# Patient Record
Sex: Male | Born: 1960 | Race: Black or African American | Hispanic: No | Smoking: Current every day smoker
Health system: Southern US, Community
[De-identification: ages and names within clinical notes are randomized; demographics above are authoritative.]

## PROBLEM LIST (undated history)

## (undated) DIAGNOSIS — B1920 Unspecified viral hepatitis C without hepatic coma: Secondary | ICD-10-CM

## (undated) DIAGNOSIS — F419 Anxiety disorder, unspecified: Secondary | ICD-10-CM

## (undated) DIAGNOSIS — F410 Panic disorder [episodic paroxysmal anxiety] without agoraphobia: Secondary | ICD-10-CM

## (undated) DIAGNOSIS — F209 Schizophrenia, unspecified: Secondary | ICD-10-CM

## (undated) DIAGNOSIS — I10 Essential (primary) hypertension: Secondary | ICD-10-CM

## (undated) DIAGNOSIS — F319 Bipolar disorder, unspecified: Secondary | ICD-10-CM

## (undated) HISTORY — PX: OTHER SURGICAL HISTORY: SHX169

---

## 2000-11-27 ENCOUNTER — Emergency Department (HOSPITAL_COMMUNITY): Admission: EM | Admit: 2000-11-27 | Discharge: 2000-11-27 | Payer: Self-pay | Admitting: Emergency Medicine

## 2001-03-21 ENCOUNTER — Emergency Department (HOSPITAL_COMMUNITY): Admission: EM | Admit: 2001-03-21 | Discharge: 2001-03-21 | Payer: Self-pay | Admitting: Emergency Medicine

## 2001-03-22 ENCOUNTER — Emergency Department (HOSPITAL_COMMUNITY): Admission: EM | Admit: 2001-03-22 | Discharge: 2001-03-22 | Payer: Self-pay | Admitting: *Deleted

## 2001-03-23 ENCOUNTER — Emergency Department (HOSPITAL_COMMUNITY): Admission: EM | Admit: 2001-03-23 | Discharge: 2001-03-23 | Payer: Self-pay | Admitting: *Deleted

## 2001-08-07 ENCOUNTER — Emergency Department (HOSPITAL_COMMUNITY): Admission: EM | Admit: 2001-08-07 | Discharge: 2001-08-07 | Payer: Self-pay | Admitting: *Deleted

## 2002-05-22 ENCOUNTER — Inpatient Hospital Stay (HOSPITAL_COMMUNITY): Admission: EM | Admit: 2002-05-22 | Discharge: 2002-05-28 | Payer: Self-pay | Admitting: *Deleted

## 2004-04-04 ENCOUNTER — Emergency Department (HOSPITAL_COMMUNITY): Admission: EM | Admit: 2004-04-04 | Discharge: 2004-04-05 | Payer: Self-pay | Admitting: Emergency Medicine

## 2005-07-14 ENCOUNTER — Inpatient Hospital Stay (HOSPITAL_COMMUNITY): Admission: EM | Admit: 2005-07-14 | Discharge: 2005-07-16 | Payer: Self-pay | Admitting: Emergency Medicine

## 2005-08-02 ENCOUNTER — Emergency Department (HOSPITAL_COMMUNITY): Admission: EM | Admit: 2005-08-02 | Discharge: 2005-08-02 | Payer: Self-pay | Admitting: Emergency Medicine

## 2006-08-04 ENCOUNTER — Emergency Department (HOSPITAL_COMMUNITY): Admission: EM | Admit: 2006-08-04 | Discharge: 2006-08-04 | Payer: Self-pay | Admitting: Emergency Medicine

## 2006-08-20 ENCOUNTER — Emergency Department (HOSPITAL_COMMUNITY): Admission: EM | Admit: 2006-08-20 | Discharge: 2006-08-20 | Payer: Self-pay | Admitting: Emergency Medicine

## 2006-12-13 ENCOUNTER — Emergency Department (HOSPITAL_COMMUNITY): Admission: EM | Admit: 2006-12-13 | Discharge: 2006-12-14 | Payer: Self-pay | Admitting: Emergency Medicine

## 2007-07-03 IMAGING — CR DG CHEST 2V
2 series · 2 of 2 positions shown · non-contrast
Comparison: 07/14/2005.

CLINICAL DATA: Smoker. Medical clearance. Suicidal.

CHEST - 2 VIEW

[w chest pa]
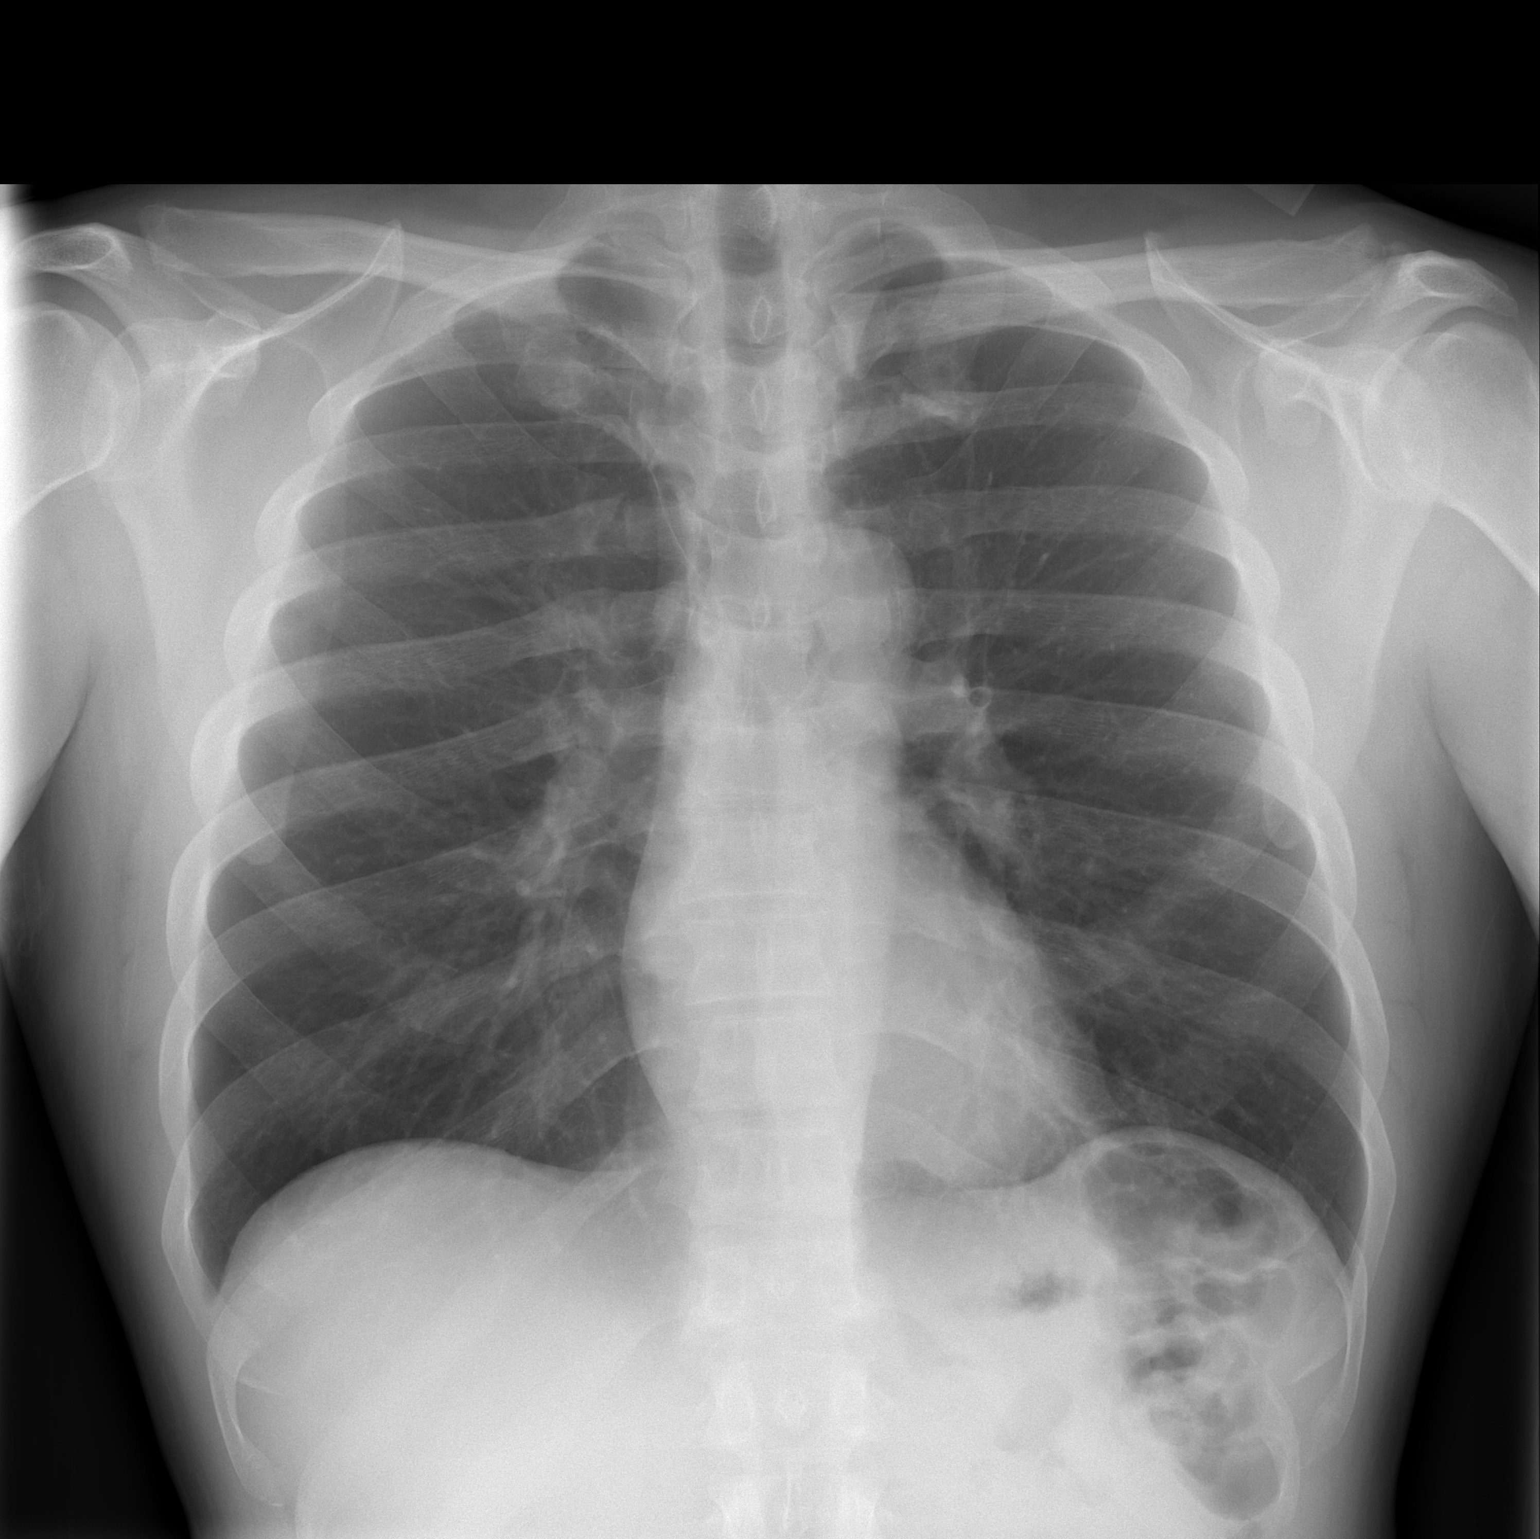

[w chest lat]
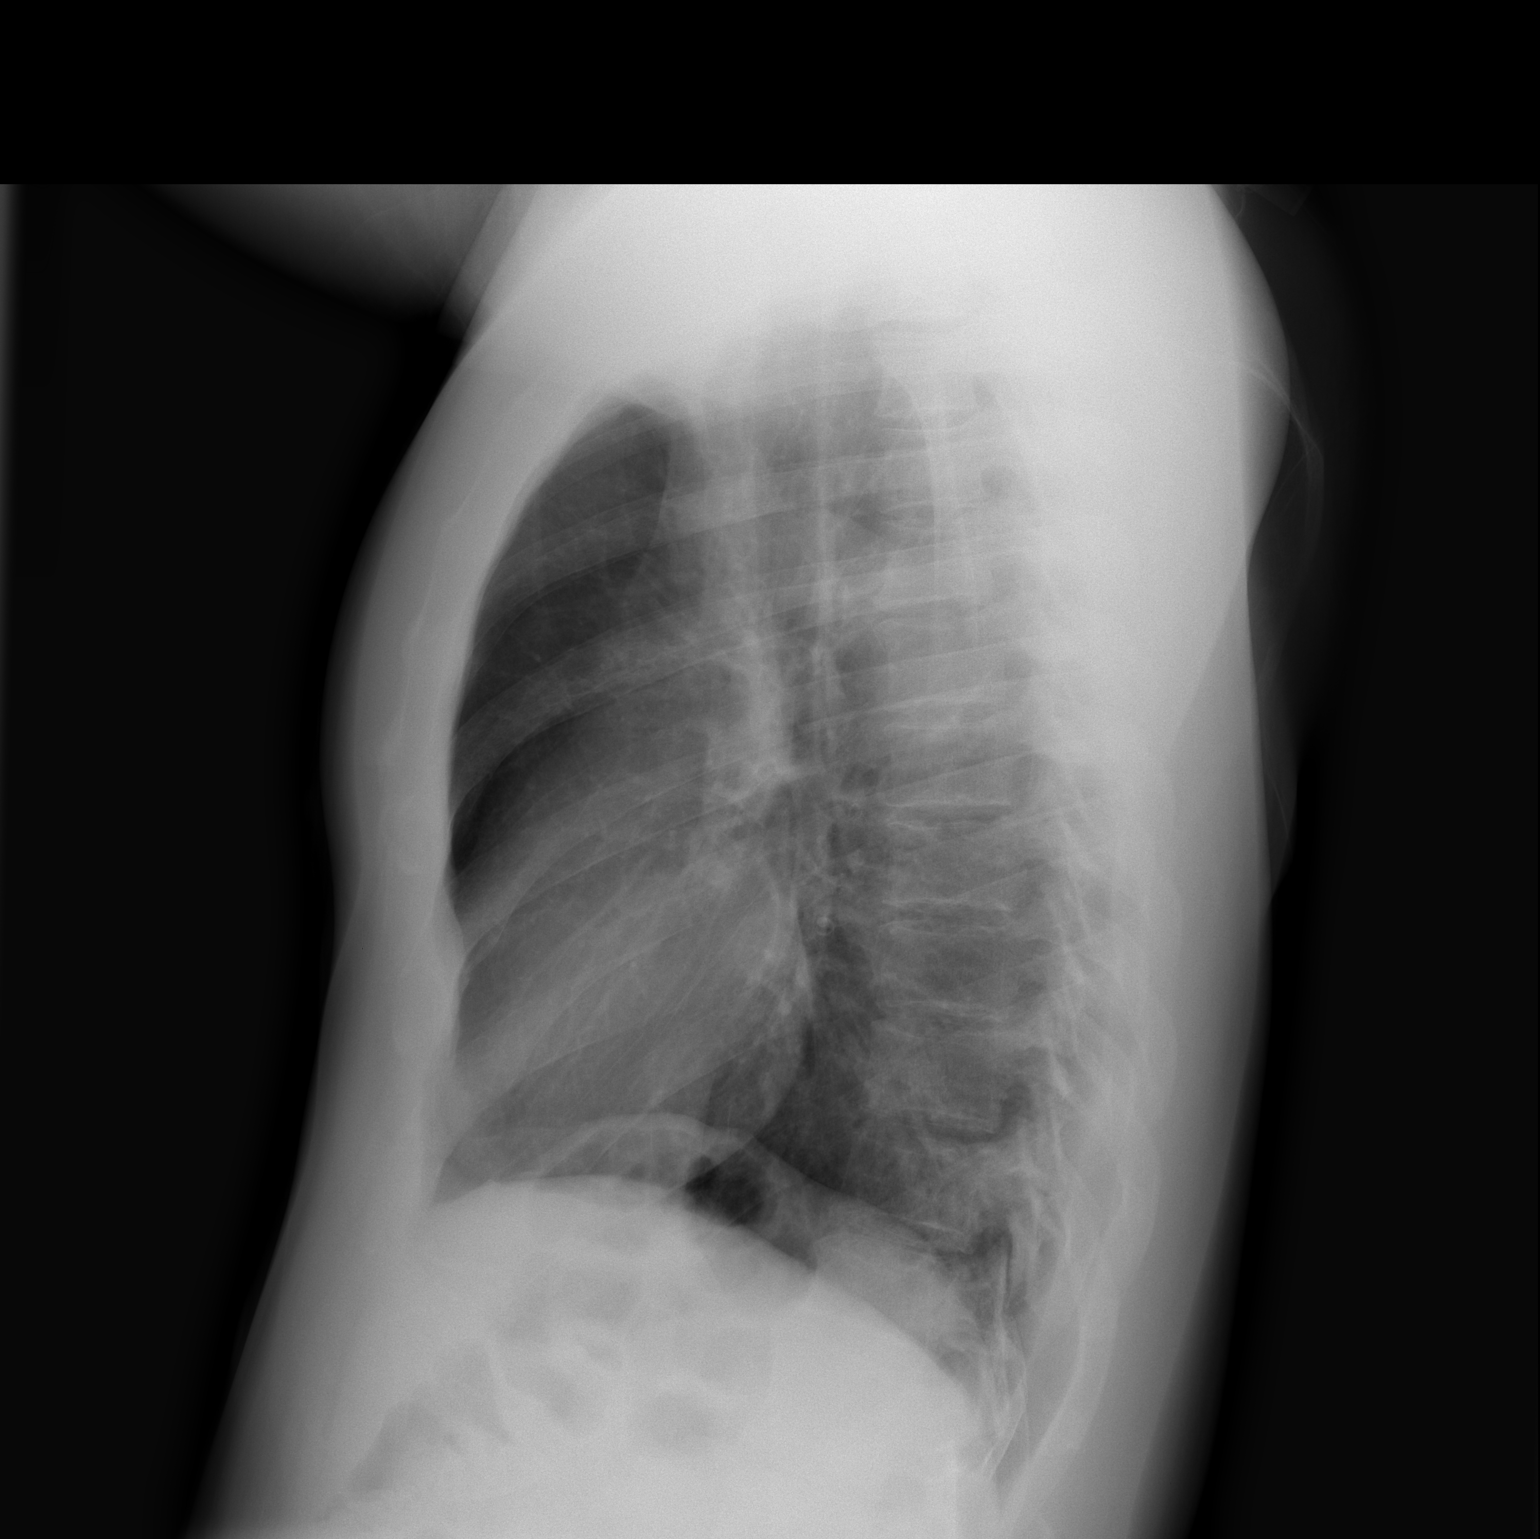

[2 of 2 positions shown; findings below may reference images not displayed]

FINDINGS: Stable normal sized heart and clear lungs. Mild central peribronchial
thickening. Minimal scoliosis.

IMPRESSION

Mild chronic bronchitic changes. No acute abnormality.

## 2015-03-31 ENCOUNTER — Encounter (HOSPITAL_COMMUNITY): Payer: Self-pay | Admitting: *Deleted

## 2015-03-31 ENCOUNTER — Encounter (HOSPITAL_COMMUNITY): Payer: Self-pay | Admitting: Emergency Medicine

## 2015-03-31 ENCOUNTER — Inpatient Hospital Stay (HOSPITAL_COMMUNITY)
Admission: AD | Admit: 2015-03-31 | Discharge: 2015-04-06 | DRG: 885 | Payer: No Typology Code available for payment source | Source: Intra-hospital | Attending: Psychiatry | Admitting: Psychiatry

## 2015-03-31 ENCOUNTER — Emergency Department (HOSPITAL_COMMUNITY)
Admission: EM | Admit: 2015-03-31 | Discharge: 2015-03-31 | Disposition: A | Discharge status: Other Acute Inpatient Hospital | Payer: Self-pay | Attending: Emergency Medicine | Admitting: Emergency Medicine

## 2015-03-31 DIAGNOSIS — F3130 Bipolar disorder, current episode depressed, mild or moderate severity, unspecified: Secondary | ICD-10-CM | POA: Diagnosis present

## 2015-03-31 DIAGNOSIS — R45851 Suicidal ideations: Secondary | ICD-10-CM | POA: Diagnosis present

## 2015-03-31 DIAGNOSIS — F410 Panic disorder [episodic paroxysmal anxiety] without agoraphobia: Secondary | ICD-10-CM | POA: Insufficient documentation

## 2015-03-31 DIAGNOSIS — F32A Depression, unspecified: Secondary | ICD-10-CM

## 2015-03-31 DIAGNOSIS — F1410 Cocaine abuse, uncomplicated: Secondary | ICD-10-CM | POA: Diagnosis present

## 2015-03-31 DIAGNOSIS — F1414 Cocaine abuse with cocaine-induced mood disorder: Secondary | ICD-10-CM | POA: Diagnosis present

## 2015-03-31 DIAGNOSIS — I10 Essential (primary) hypertension: Secondary | ICD-10-CM | POA: Diagnosis present

## 2015-03-31 DIAGNOSIS — B1920 Unspecified viral hepatitis C without hepatic coma: Secondary | ICD-10-CM | POA: Diagnosis present

## 2015-03-31 DIAGNOSIS — Z8619 Personal history of other infectious and parasitic diseases: Secondary | ICD-10-CM | POA: Insufficient documentation

## 2015-03-31 DIAGNOSIS — Z79899 Other long term (current) drug therapy: Secondary | ICD-10-CM | POA: Insufficient documentation

## 2015-03-31 DIAGNOSIS — F17210 Nicotine dependence, cigarettes, uncomplicated: Secondary | ICD-10-CM | POA: Diagnosis present

## 2015-03-31 DIAGNOSIS — F3132 Bipolar disorder, current episode depressed, moderate: Principal | ICD-10-CM | POA: Diagnosis present

## 2015-03-31 DIAGNOSIS — Z720 Tobacco use: Secondary | ICD-10-CM | POA: Insufficient documentation

## 2015-03-31 DIAGNOSIS — Z9114 Patient's other noncompliance with medication regimen: Secondary | ICD-10-CM | POA: Diagnosis present

## 2015-03-31 DIAGNOSIS — F39 Unspecified mood [affective] disorder: Secondary | ICD-10-CM | POA: Diagnosis present

## 2015-03-31 DIAGNOSIS — F4310 Post-traumatic stress disorder, unspecified: Secondary | ICD-10-CM | POA: Diagnosis present

## 2015-03-31 DIAGNOSIS — F329 Major depressive disorder, single episode, unspecified: Secondary | ICD-10-CM

## 2015-03-31 DIAGNOSIS — F209 Schizophrenia, unspecified: Secondary | ICD-10-CM | POA: Insufficient documentation

## 2015-03-31 HISTORY — DX: Schizophrenia, unspecified: F20.9

## 2015-03-31 HISTORY — DX: Bipolar disorder, unspecified: F31.9

## 2015-03-31 HISTORY — DX: Panic disorder (episodic paroxysmal anxiety): F41.0

## 2015-03-31 HISTORY — DX: Essential (primary) hypertension: I10

## 2015-03-31 HISTORY — DX: Anxiety disorder, unspecified: F41.9

## 2015-03-31 HISTORY — DX: Unspecified viral hepatitis C without hepatic coma: B19.20

## 2015-03-31 LAB — RAPID URINE DRUG SCREEN, HOSP PERFORMED
Amphetamines: NOT DETECTED
BENZODIAZEPINES: NOT DETECTED
Barbiturates: NOT DETECTED
COCAINE: NOT DETECTED
Opiates: NOT DETECTED
Tetrahydrocannabinol: NOT DETECTED

## 2015-03-31 LAB — CBC WITH DIFFERENTIAL/PLATELET
Basophils Absolute: 0.1 10*3/uL (ref 0.0–0.1)
Basophils Relative: 1 % (ref 0–1)
Eosinophils Absolute: 0.1 10*3/uL (ref 0.0–0.7)
Eosinophils Relative: 2 % (ref 0–5)
HEMATOCRIT: 40.1 % (ref 39.0–52.0)
Hemoglobin: 13.2 g/dL (ref 13.0–17.0)
Lymphocytes Relative: 36 % (ref 12–46)
Lymphs Abs: 2.1 10*3/uL (ref 0.7–4.0)
MCH: 28.6 pg (ref 26.0–34.0)
MCHC: 32.9 g/dL (ref 30.0–36.0)
MCV: 86.8 fL (ref 78.0–100.0)
MONOS PCT: 5 % (ref 3–12)
Monocytes Absolute: 0.3 10*3/uL (ref 0.1–1.0)
Neutro Abs: 3.2 10*3/uL (ref 1.7–7.7)
Neutrophils Relative %: 56 % (ref 43–77)
Platelets: 242 10*3/uL (ref 150–400)
RBC: 4.62 MIL/uL (ref 4.22–5.81)
RDW: 13.6 % (ref 11.5–15.5)
WBC: 5.9 10*3/uL (ref 4.0–10.5)

## 2015-03-31 LAB — COMPREHENSIVE METABOLIC PANEL
ALT: 27 U/L (ref 17–63)
AST: 22 U/L (ref 15–41)
Albumin: 3.5 g/dL (ref 3.5–5.0)
Alkaline Phosphatase: 75 U/L (ref 38–126)
Anion gap: 7 (ref 5–15)
BUN: 14 mg/dL (ref 6–20)
CALCIUM: 8.9 mg/dL (ref 8.9–10.3)
CO2: 29 mmol/L (ref 22–32)
Chloride: 104 mmol/L (ref 101–111)
Creatinine, Ser: 1.16 mg/dL (ref 0.61–1.24)
GFR calc non Af Amer: >60 mL/min (ref >60)
Glucose, Bld: 87 mg/dL (ref 65–99)
Potassium: 4.5 mmol/L (ref 3.5–5.1)
Sodium: 140 mmol/L (ref 135–145)
Total Bilirubin: 0.4 mg/dL (ref 0.3–1.2)
Total Protein: 7.2 g/dL (ref 6.5–8.1)

## 2015-03-31 LAB — ETHANOL: Alcohol, Ethyl (B): <5 mg/dL (ref <5)

## 2015-03-31 LAB — ACETAMINOPHEN LEVEL: Acetaminophen (Tylenol), Serum: <10 ug/mL — ABNORMAL LOW (ref 10–30)

## 2015-03-31 LAB — SALICYLATE LEVEL: Salicylate Lvl: <4 mg/dL (ref 2.8–30.0)

## 2015-03-31 MED ORDER — TRAZODONE HCL 50 MG PO TABS
50.0000 mg | ORAL_TABLET | Freq: Every evening | ORAL | Status: DC | PRN
  Administered 2015-04-04: 50 mg via ORAL
  Filled 2015-03-31: qty 1

## 2015-03-31 MED ORDER — FLUOXETINE HCL 20 MG PO CAPS
20.0000 mg | ORAL_CAPSULE | Freq: Every day | ORAL | Status: DC
  Administered 2015-04-01 – 2015-04-06 (×6): 20 mg via ORAL
  Filled 2015-03-31 (×7): qty 1

## 2015-03-31 MED ORDER — MAGNESIUM HYDROXIDE 400 MG/5ML PO SUSP
30.0000 mL | Freq: Every day | ORAL | Status: DC | PRN
  Administered 2015-04-04: 30 mL via ORAL
  Filled 2015-03-31: qty 30

## 2015-03-31 MED ORDER — ACETAMINOPHEN 325 MG PO TABS
650.0000 mg | ORAL_TABLET | Freq: Four times a day (QID) | ORAL | Status: DC | PRN
  Administered 2015-03-31 – 2015-04-05 (×7): 650 mg via ORAL
  Filled 2015-03-31 (×7): qty 2

## 2015-03-31 MED ORDER — QUETIAPINE FUMARATE 300 MG PO TABS
300.0000 mg | ORAL_TABLET | Freq: Every day | ORAL | Status: DC
  Administered 2015-03-31 – 2015-04-05 (×6): 300 mg via ORAL
  Filled 2015-03-31 (×8): qty 1

## 2015-03-31 MED ORDER — ALUM & MAG HYDROXIDE-SIMETH 200-200-20 MG/5ML PO SUSP: 30.0000 mL | ORAL | Status: DC | PRN

## 2015-03-31 MED ORDER — FLUOXETINE HCL 20 MG PO CAPS
20.0000 mg | ORAL_CAPSULE | Freq: Every day | ORAL | Status: DC
  Administered 2015-03-31: 20 mg via ORAL
  Filled 2015-03-31: qty 1

## 2015-03-31 MED ORDER — HYDROXYZINE HCL 50 MG PO TABS
50.0000 mg | ORAL_TABLET | Freq: Three times a day (TID) | ORAL | Status: DC | PRN
  Administered 2015-04-01 – 2015-04-06 (×12): 50 mg via ORAL
  Filled 2015-03-31 (×12): qty 1

## 2015-03-31 MED ORDER — HYDROXYZINE HCL 25 MG PO TABS
25.0000 mg | ORAL_TABLET | Freq: Four times a day (QID) | ORAL | Status: DC | PRN
  Administered 2015-03-31: 25 mg via ORAL
  Filled 2015-03-31: qty 1

## 2015-03-31 MED ORDER — QUETIAPINE FUMARATE 100 MG PO TABS: 300.0000 mg | ORAL_TABLET | Freq: Every day | ORAL | Status: DC

## 2015-03-31 NOTE — ED Notes (Signed)
Pt states that he has been having suicidal ideation for past week since being incarcerated  - Did not have any medication while in jail and was released today - called EMS to come pick him up from the jail- States also having panic attack  States that he has been off his meds for 9 days - Pt is from [place]

## 2015-03-31 NOTE — ED Provider Notes (Signed)
CSN: [id number]     Arrival date & time 03/31/15  1642 History   First MD Initiated Contact with Patient 03/31/15 1701     Chief Complaint  Patient presents with  . Suicidal  . Panic Attack      HPI   Patient presents for evaluation of anxiety and being suicidal. Normally lives in [place]. He is [facility] until 2 weeks ago. He states he was discharged. 2 days later he was arrested. He has been incarcerated for 10 days. Has been off his medications. States he's been anxious the whole time. Discharged today. Anxious spells like he has R wrist harm himself does not have a specific plan has done nothing to this point to inflict self-harm. Last hospitalization was for "an overdose. States he overdosed on "all my medicines".   Past Medical History  Diagnosis Date  . Bipolar 1 disorder   . Depressed bipolar disorder   . Anxiety   . Panic attacks   . Hypertension   . Hepatitis C   . Schizophrenia    Past Surgical History  Procedure Laterality Date  . None     History reviewed. No pertinent family history. History  Substance Use Topics  . Smoking status: Current Every Day Smoker -- 0.50 packs/day for 40 years    Types: Cigarettes  . Smokeless tobacco: Never Used  . Alcohol Use: Yes     Comment: occassionally    Review of Systems  Constitutional: Negative for fever, chills, diaphoresis, appetite change and fatigue.  HENT: Negative for mouth sores, sore throat and trouble swallowing.   Eyes: Negative for visual disturbance.  Respiratory: Negative for cough, chest tightness, shortness of breath and wheezing.   Cardiovascular: Negative for chest pain.  Gastrointestinal: Negative for nausea, vomiting, abdominal pain, diarrhea and abdominal distention.  Endocrine: Negative for polydipsia, polyphagia and polyuria.  Genitourinary: Negative for dysuria, frequency and hematuria.  Musculoskeletal: Negative for gait problem.  Skin: Negative for color change,  pallor and rash.  Neurological: Negative for dizziness, syncope, light-headedness and headaches.  Hematological: Does not bruise/bleed easily.  Psychiatric/Behavioral: Positive for suicidal ideas. Negative for behavioral problems and confusion. The patient is nervous/anxious.       Allergies  Review of patient's allergies indicates no known allergies.  Home Medications   Prior to Admission medications   Medication Sig Start Date End Date Taking? Authorizing Provider  FLUoxetine (PROZAC) 20 MG capsule Take 20 mg by mouth daily.   Yes Historical Provider, MD  hydrOXYzine (ATARAX/VISTARIL) 50 MG tablet Take 50 mg by mouth 3 (three) times daily as needed.   Yes Historical Provider, MD  QUEtiapine (SEROQUEL) 300 MG tablet Take 300 mg by mouth at bedtime.   Yes Historical Provider, MD   BP 128/70 mmHg  Pulse 67  Temp(Src) 98.6 F (37 C) (Oral)  Resp 18  Ht 5\' 8"  (1.727 m)  Wt 185 lb (83.915 kg)  BMI 28.14 kg/m2  SpO2 99% Physical Exam  Constitutional: He is oriented to person, place, and time. He appears well-developed and well-nourished. No distress.  HENT:  Head: Normocephalic.  Eyes: Conjunctivae are normal. Pupils are equal, round, and reactive to light. No scleral icterus.  Neck: Normal range of motion. Neck supple. No thyromegaly present.  Cardiovascular: Normal rate and regular rhythm.  Exam reveals no gallop and no friction rub.   No murmur heard. Pulmonary/Chest: Effort normal and breath sounds normal. No respiratory distress. He has no wheezes. He has no rales.  Abdominal: Soft. Bowel sounds are normal. He exhibits no distension. There is no tenderness. There is no rebound.  Musculoskeletal: Normal range of motion.  Neurological: He is alert and oriented to person, place, and time.  Skin: Skin is warm and dry. No rash noted.  Psychiatric: His behavior is normal. His mood appears anxious. His speech is not rapid and/or pressured. He does not exhibit a depressed mood.     ED Course  Procedures (including critical care time) Labs Review Labs Reviewed  ACETAMINOPHEN LEVEL - Abnormal; Notable for the following:    Acetaminophen (Tylenol), Serum <10 (*)    All other components within normal limits  CBC WITH DIFFERENTIAL/PLATELET  COMPREHENSIVE METABOLIC PANEL  ETHANOL  SALICYLATE LEVEL  URINE RAPID DRUG SCREEN, HOSP PERFORMED    Imaging Review No results found.   EKG Interpretation None      MDM   Final diagnoses:  Depression    Await TTS.    Rolland Porter, MD 04/01/15 [phone]

## 2015-03-31 NOTE — BH Assessment (Addendum)
Assessment Note  Patient is a 54 year old African American male that reports SI with a plan to overdose.  Patient reports that he has been feeling depressed and anxious since being incarcerated for 10 days in [place].   Patient reports that he has been off his medication.  Patient reports that he cannot remember who prescribes his medication or the name of the facility when he got his medication.  Patient reports that he does not remember the last time that he took his psychiatric medication.  Patient reports that he is diagnosed with Bipolar Disorder and Schizophrenia.    Patient reports that he was in jail due to having a pocket knife.  Patient would not elaborate on what he was planning to do with the pocket knife or why he had the pocket knife.  Patient only stated that the pocket knife belonged to him.   Patient reported that he saw and heard voices and shadows telling him to hurt himself 3 days ago when he was in jail.  Patient denied receiving any psychiatric medication while in jail.   Patient reports that he has been abusing cocaine for over 30 years. Patient reports that he uses every weekend when he gets paid.  Patient reports that he uses as much as he can.  Patient was not able to tell me how he uses when he gets paid.  Patient reports that his last use was after her was released from prison 2 days ago.  Patient reports that he does not remember how much crack he used 2 days ago.  Patient reports that he cannot go back to [place] to his apartment because he will use again.    Patient reports working at [employer] and denies having any family or social supports.  Patient denies any history of seizures.  Patient denies withdrawal symptoms.   Patient reports substance abuse treatment in 2015 in [place].   Patient denies physical, sexual or emotional abused. Patient denies HI.  Patient denies self-mutilation.  Patient reports that the last time to cut himself on purpose was in 2008.       Axis I: Mood Disorder NOS and Cocaine Abuse  Axis II: Deferred Axis III:  Past Medical History  Diagnosis Date  . Bipolar 1 disorder   . Depressed bipolar disorder   . Anxiety   . Panic attacks   . Hypertension   . Hepatitis C    Axis IV: economic problems, housing problems, occupational problems, other psychosocial or environmental problems, problems related to social environment, problems with access to health care services and problems with primary support group Axis V: 31-40 impairment in reality testing  Past Medical History:  Past Medical History  Diagnosis Date  . Bipolar 1 disorder   . Depressed bipolar disorder   . Anxiety   . Panic attacks   . Hypertension   . Hepatitis C     Past Surgical History  Procedure Laterality Date  . None      Family History: History reviewed. No pertinent family history.  Social History:  reports that he has been smoking Cigarettes.  He has a 20 pack-year smoking history. He has never used smokeless tobacco. He reports that he drinks alcohol. He reports that he uses illicit drugs (Cocaine) about twice per week.  Additional Social History:  Alcohol / Drug Use History of alcohol / drug use?: No history of alcohol / drug abuse Longest period of sobriety (when/how long): 9 days when he was in jail  Negative Consequences of Use: Financial, Legal, Personal relationships, Work / School Withdrawal Symptoms:  (None Reported)  CIWA: CIWA-Ar BP: 138/83 mmHg Pulse Rate: 63 COWS:    Allergies: No Known Allergies  Home Medications:  (Not in a hospital admission)  OB/GYN Status:  No LMP for male patient.  General Assessment Data Location of Assessment: [facility] TTS Assessment: In system Is this a Tele or Face-to-Face Assessment?: Tele Assessment Is this an Initial Assessment or a Re-assessment for this encounter?: Initial Assessment Marital status: Single Maiden name: NA Is patient pregnant?: No Pregnancy Status: No Living  Arrangements: Alone Can pt return to current living arrangement?: Yes Admission Status: Voluntary Is patient capable of signing voluntary admission?: Yes Referral Source: Self/Family/Friend Insurance type: Self Pay   Medical Screening Exam [facility] Walk-in ONLY) Medical Exam completed: Yes  Crisis Care Plan Living Arrangements: Alone Name of Psychiatrist: None Reported Name of Therapist: None Reported  Education Status Is patient currently in school?: No Current Grade: NA Highest grade of school patient has completed: NA Name of school: NA Contact person: NA  Risk to self with the past 6 months Suicidal Ideation: Yes-Currently Present Has patient been a risk to self within the past 6 months prior to admission? : Yes Suicidal Intent: Yes-Currently Present Has patient had any suicidal intent within the past 6 months prior to admission? : Yes Is patient at risk for suicide?: Yes Suicidal Plan?: Yes-Currently Present Has patient had any suicidal plan within the past 6 months prior to admission? 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 : Yes (comment) (Last incident of violence was in 1998.) Thoughts of Harm to Others: No Current Homicidal Intent: No Current Homicidal Plan: No Access to Homicidal Means: No Identified Victim: None Reported History of harm to others?: No Assessment of Violence: None Noted Violent Behavior Description: None Reported Does patient have access to weapons?: No Criminal Charges Pending?: No Does patient have a court date: No Is patient on probation?: No  Psychosis Hallucinations: Auditory, Visual (Last time he saw something was when he was in jail 3 days ag) Delusions: None noted  Mental Status Report Appearance/Hygiene: Disheveled, In scrubs Eye Contact: Fair Motor Activity: Freedom of movement Speech: Logical/coherent Level of Consciousness: Alert Mood: Depressed, Anxious, Helpless, Worthless, low self-esteem Affect: Depressed, Blunted Anxiety Level: None Thought Processes: Coherent, Relevant Judgement: Unimpaired Orientation: Person, Time, Place, Situation Obsessive Compulsive Thoughts/Behaviors: None  Cognitive Functioning Concentration: Decreased Memory: Recent Intact, Remote Intact IQ: Average Insight: Fair Impulse Control: Poor Appetite: Poor Weight Loss: 15 (Lost 15 pounds in 3 weeks.) Weight Gain: 0 Sleep:  Decreased Total Hours of Sleep: 2 Vegetative Symptoms: Decreased grooming, Staying in bed  ADLScreening [facility] Assessment Services) Patient's cognitive ability adequate to safely complete daily activities?: Yes Patient able to express need for assistance with ADLs?: No Independently performs ADLs?: Yes (appropriate for developmental age)  Prior Inpatient Therapy Prior Inpatient Therapy: Yes Prior Therapy Dates: 2016 (2 weeks ago ) Prior Therapy  [facility] Reason for Treatment: SI  Prior Outpatient Therapy Prior Outpatient Therapy: Yes Prior Therapy Dates: Unable to rememer the date Prior Therapy Facilty/Provider(s): Unable to remember the name of the facility  Reason for Treatment: Medication Management  Does patient have an ACCT team?: No Does patient have Intensive In-House Services?  : No Does patient have [facility] services? : No Does patient have P4CC services?: No  ADL Screening (condition at time of admission) Patient's cognitive ability adequate to safely complete daily activities?: Yes Is the patient deaf or have difficulty hearing?: No Does the patient have difficulty seeing, even when wearing glasses/contacts?: No Does the patient have difficulty concentrating, remembering, or making decisions?: No Patient able to express need for assistance with ADLs?: No Does the patient have difficulty dressing or bathing?: No Independently performs ADLs?: Yes (appropriate for developmental age) Does the patient have difficulty walking or climbing stairs?: No Weakness of Legs: None Weakness of Arms/Hands: None  Home Assistive Devices/Equipment Home Assistive Devices/Equipment: None    Abuse/Neglect Assessment (Assessment to be complete while patient is alone) Physical Abuse: Denies Verbal Abuse: Denies Sexual Abuse: Denies Exploitation of patient/patient's resources: Denies Self-Neglect: Denies Values / Beliefs Cultural Requests During Hospitalization: None Spiritual Requests During Hospitalization: None Consults Spiritual Care Consult Needed: No Social Work Consult Needed: No Merchant navy officerAdvance Directives (For Healthcare) Does patient have an advance directive?: No Would patient like information on creating an advanced directive?: No - patient declined information    Additional Information 1:1 In Past 12 Months?: No CIRT Risk: No Elopement Risk: No Does patient have medical clearance?: Yes      Disposition: Per Dr. Gilmore LarocheAkhtar patient meets criteria for inpatient criteria.  Per Inetta Fermoina [facility]  Disposition Initial Assessment Completed for this Encounter: Yes Disposition of Patient: Inpatient treatment program Type of inpatient treatment program: Adult (Per Dr. Lynnae SandhoffAkthar patient meets criteria )  On Site Evaluation by:   Reviewed with Physician:    Phillip HealStevenson, Hagen Tidd LaVerne 03/31/2015 6:19 PM

## 2015-04-01 ENCOUNTER — Encounter (HOSPITAL_COMMUNITY): Payer: Self-pay | Admitting: Psychiatry

## 2015-04-01 DIAGNOSIS — F1414 Cocaine abuse with cocaine-induced mood disorder: Secondary | ICD-10-CM | POA: Diagnosis present

## 2015-04-01 DIAGNOSIS — R45851 Suicidal ideations: Secondary | ICD-10-CM

## 2015-04-01 DIAGNOSIS — F3130 Bipolar disorder, current episode depressed, mild or moderate severity, unspecified: Secondary | ICD-10-CM

## 2015-04-01 MED ORDER — ENSURE ENLIVE PO LIQD
237.0000 mL | Freq: Two times a day (BID) | ORAL | Status: DC
  Administered 2015-04-01 – 2015-04-05 (×9): 237 mL via ORAL

## 2015-04-01 MED ORDER — NICOTINE 14 MG/24HR TD PT24
14.0000 mg | MEDICATED_PATCH | Freq: Every day | TRANSDERMAL | Status: DC
  Administered 2015-04-01 – 2015-04-06 (×6): 14 mg via TRANSDERMAL
  Filled 2015-04-01 (×7): qty 1

## 2015-04-01 MED ORDER — PNEUMOCOCCAL VAC POLYVALENT 25 MCG/0.5ML IJ INJ
0.5000 mL | INJECTION | INTRAMUSCULAR | Status: AC
  Administered 2015-04-02: 0.5 mL via INTRAMUSCULAR

## 2015-04-01 NOTE — [facility] Suicide Risk Assessment (Signed)
[facility] Admission Suicide Risk Assessment   Nursing information obtained from:  Patient, Review of record Demographic factors:  Male, Low socioeconomic status, Unemployed Current Mental Status:  Suicidal ideation indicated by patient, Self-harm thoughts Loss Factors:  Decrease in vocational status, Legal issues, Financial problems / change in socioeconomic status Historical Factors:  Prior suicide attempts, Family history of mental illness or substance abuse Risk Reduction Factors:  NA Total Time spent with patient: 45 minutes Principal Problem: Bipolar I disorder, most recent episode depressed Diagnosis:   Patient Active Problem List   Diagnosis Date Noted  . Bipolar I disorder, most recent episode depressed [F31.30] 04/01/2015  . Mood disorder [F39] 03/31/2015     Continued Clinical Symptoms:  Alcohol Use Disorder Identification Test Final Score (AUDIT): 1 The "Alcohol Use Disorders Identification Test", Guidelines for Use in Primary Care, Second Edition.  World Science writer [facility]). Score between 0-7:  no or low risk or alcohol related problems. Score between 8-15:  moderate risk of alcohol related problems. Score between 16-19:  high risk of alcohol related problems. Score 20 or above:  warrants further diagnostic evaluation for alcohol dependence and treatment.   CLINICAL FACTORS:   Bipolar Disorder:   Depressive phase   Musculoskeletal: Strength & Muscle Tone: within normal limits Gait & Station: normal Patient leans: normal  Psychiatric Specialty Exam: Physical Exam  Review of Systems  Constitutional: Positive for malaise/fatigue.  Respiratory: Positive for cough and shortness of breath.        Smokes   Cardiovascular: Negative.   Gastrointestinal: Positive for constipation.  Genitourinary: Negative.   Musculoskeletal: Positive for back pain.  Skin: Negative.   Neurological: Positive for weakness and headaches.  Endo/Heme/Allergies: Negative.    Psychiatric/Behavioral: Positive for depression, suicidal ideas and substance abuse. The patient is nervous/anxious and has insomnia.     Blood pressure 105/64, pulse 84, temperature 98.4 F (36.9 C), temperature source Oral, resp. rate 18, height 5' 4.5" (1.638 m), weight 80.287 kg (177 lb), SpO2 100 %.Body mass index is 29.92 kg/(m^2).  General Appearance: Disheveled  Eye Contact::  Minimal  Speech:  Clear and Coherent, Slow and not spontaneous  Volume:  Decreased  Mood:  Anxious and Depressed  Affect:  Restricted  Thought Process:  Coherent and Goal Directed  Orientation:  Full (Time, Place, and Person)  Thought Content:  symptoms events worries concerns  Suicidal Thoughts:  No  Homicidal Thoughts:  No  Memory:  Immediate;   Fair Recent;   Fair Remote;   Fair  Judgement:  Fair  Insight:  Present  Psychomotor Activity:  Restlessness  Concentration:  Fair  Recall:  [employer] of Knowledge:Fair  Language: Fair  Akathisia:  No  Handed:  Right  AIMS (if indicated):     Assets:  Desire for Improvement  Sleep:  Number of Hours: 5.75  Cognition: WNL  ADL's:  Intact     COGNITIVE FEATURES THAT CONTRIBUTE TO RISK:  Closed-mindedness, Polarized thinking and Thought constriction (tunnel vision)    SUICIDE RISK:   Moderate:  Frequent suicidal ideation with limited intensity, and duration, some specificity in terms of plans, no associated intent, good self-control, limited dysphoria/symptomatology, some risk factors present, and identifiable protective factors, including available and accessible social support. 54 Y/O male who is in bed and asked that I return later to talk to him but eventually cooperates at least minimally with  the assessment. He states he has been off his medications and wants to get back on them. Endorses  multiple symptoms. Mostly wants to get back on his medications. He is vague in answering most of the questions PLAN OF CARE: Supportive approach/coping skills                                Bipolar Disorder; resume his psychotropic medications                                Get collateral information                                 Cocaine Abuse; work a relapse prevention plan Medical Decision Making:  Review of Psycho-Social Stressors (1), Review or order clinical lab tests (1), Review of Medication Regimen & Side Effects (2) and Review of New Medication or Change in Dosage (2)  I certify that inpatient services furnished can reasonably be expected to improve the patient's condition.   Blake Mann A 04/01/2015, 7:08 PM

## 2015-04-01 NOTE — Clinical Social Work Note (Signed)
Patient requested referral to [facility] for residential SA tx.  Referral made.  Santa GeneraAnne Cunningham, LCSW Clinical Social Worker

## 2015-04-01 NOTE — BHH Counselor (Signed)
Adult Comprehensive Assessment  Patient ID: Blake Mann, male   DOB: 04-20-1961, 54 y.o.   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, sober until 2 months ago Bereavement / Loss: none noted  Living/Environment/Situation:  Living Arrangements: Alone Living conditions (as described by patient or guardian): extradited to [place] to face "old" court charges, charges dismissed, from [place] and does not want to return How long has patient lived in current situation?: few days What is atmosphere in current home: Temporary  Family History:  Marital status: Separated Separated, when?: 2007 What types of issues is patient dealing with in the relationship?: No contact w wife Additional relationship information: Little contact w any family Does patient have children?: Yes How many children?: 2 How is patient's relationship with their children?: Children live in [place], has not seen in a year  Childhood History:  By whom was/is the patient raised?: Grandparents Description of patient's relationship with caregiver when they were a child: Good Patient's description of current relationship with people who raised him/her: Parents and grandparents are all deceased Does patient have siblings?: No Did patient suffer any verbal/emotional/physical/sexual abuse as a child?: No Did patient suffer from severe childhood neglect?: No Has patient ever been sexually abused/assaulted/raped as an adolescent or adult?: No Was the patient  ever a victim of a crime or a disaster?: No Witnessed domestic violence?: No Has patient been effected by domestic violence as an adult?: No  Education:  Highest grade of school patient has completed: 4711 Name of school: unknown Learning disability?: No  Employment/Work Situation:   Employment situation: Unemployed Patient's job has been impacted by current illness: Yes Describe how patient's job has been impacted: Patient lost temporary job due to [employer] to face old charges What is the longest time patient has a held a job?: unknown Where was the patient employed at that time?: unknown Has patient ever been in the [employer] and Companymilitary?: No Has patient ever served in Buyer, retailcombat?: No  Financial Resources:   Financial resources: No income Does patient have a Lawyerrepresentative payee or guardian?: No  Alcohol/Substance Abuse:   What has been your use of drugs/alcohol within the last 12 months?: Was sober for approx 28 months after stay at [facility]), relapsed on crack approx 2 months ago.  Had 30 year history of cocain abuse If attempted suicide, did drugs/alcohol play a role in this?: No Alcohol/Substance Abuse Treatment Hx: Attends AA/NA, Past Tx, Outpatient If yes, describe treatment: [facility] in [place] Has alcohol/substance abuse ever caused legal problems?: Yes (possession charges)  Social Support System:   Patient's Community Support System: None Describe Community Support System: knows no one in [place], does not want to return to [place], no supportive family Type of faith/religion: [employer]ri[employer] Kn[employer] How does patient's faith help to cope with current illness?: unclear, attends 12 step groups  Leisure/Recreation:   Leisure and Hobbies: watch tv  Strengths/Needs:   What things does the patient do well?: hard worker In what areas does patient struggle / problems for patient: homelessness, "starting over"  Discharge Plan:   Does patient have access to  transportation?:  No Plan for no access to transportation at discharge: bus pass Will patient be returning to same living situation after discharge?: No Plan for living situation after discharge: referrals for residential sa tx or halfway house Currently receiving community mental health services: No If no, would patient like referral for services when discharged?: Yes (What county?) Does patient have financial barriers related to discharge medications?: Yes Patient description of barriers related to discharge medications: no income, no insurance  Summary/Recommendations:     Patient is a 54 year old AA male, admitted for treatment of SI and bipolar disorder. Patient was recently extradited to [place] for court for "old charges" - case was dismissed, patient released from jail without any medications provided.  Patient presented at ED w SI, admitted to [facility] for stabilization.  Patient reports a long history of crack cocaine abuse, was involved w residential tx program associated w [facility] in 2014 - after that had approx 28 months of sobriety. Began to relapse approx 2 months ago, has used cocaine on 4 occasions, unable to state amount used.  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  Has lost job and housing due to [employer].  Parents and grandparents who raised him are deceased, patient separated from wife since early 2000s, has two adult children in [place] whom he has not seen in two years.  Patient wants residential SA tx program, does not want to return to [facility] at discharge.  Patient is smoker, declined Quitline referral, refused consent for SPE or contact w family.  Patient will benefit from hospitalization to receive psychoeducation and group therapy services to increase coping skills for and understanding of bipolar disorder, milieu therapy, medications management, and nursing support.  Patient will develop appropriate  coping skills for dealing w overwhelming emotions, stabilize on medications, and develop greater insight into and acceptance of his current illness.  CSWs will develop discharge plan to include family support and referral to appropriate after care services.  Santa Genera, LCSW Clinical Social Worker  Santa Genera C. 04/01/2015

## 2015-04-01 NOTE — Tx Team (Signed)
Initial Interdisciplinary Treatment Plan   PATIENT STRESSORS: Financial difficulties Legal issue Medication change or noncompliance Occupational concerns Substance abuse   PATIENT STRENGTHS: Average or above average intelligence Capable of independent living General fund of knowledge Motivation for treatment/growth   PROBLEM LIST: Problem List/Patient Goals Date to be addressed Date deferred Reason deferred Estimated date of resolution  Crack cocaine use      Risk for self harm      Depression      Homelessness      Legal issues-just got out of jail            "I need help getting housing"      "I want to get back on my medications for my hallucinations"      "I need help getting off of crack cocaine"       DISCHARGE CRITERIA:  Ability to meet basic life and health needs Adequate post-discharge living arrangements Improved stabilization in mood, thinking, and/or behavior Motivation to continue treatment in a less acute level of care Need for constant or close observation no longer present Verbal commitment to aftercare and medication compliance Withdrawal symptoms are absent or subacute and managed without 24-hour nursing intervention  PRELIMINARY DISCHARGE PLAN: Attend aftercare/continuing care group Attend 12-step recovery group Outpatient therapy Placement in alternative living arrangements  PATIENT/FAMIILY INVOLVEMENT: This treatment plan has been presented to and reviewed with the patient, Blake Mann, and/or family member.  The patient and family have been given the opportunity to ask questions and make suggestions.  Blake Mann, [facility] 04/01/2015, 12:11 AM

## 2015-04-01 NOTE — [facility] Group Notes (Signed)
[facility]   04/01/2015 12:09 PM  Participation Quality:  Invited.  Chose to not attend.    Daryel GeraldNorth, Sharonann Malbrough B

## 2015-04-01 NOTE — BHH Suicide Risk Assessment (Signed)
B[facility] INPATIENT:  Family/Significant Other Suicide Prevention Education  Suicide Prevention Education:  Patient Refusal for Family/Significant Other Suicide Prevention Education: The patient Blake Mann has refused to provide written consent for family/significant other to be provided Family/Significant Other Suicide Prevention Education during admission and/or prior to discharge.  Physician notified.  Sallee LangeCunningham, Kourtney Terriquez C 04/01/2015, 3:42 PM

## 2015-04-01 NOTE — BHH Group Notes (Signed)
B[facility] LCSW Group Therapy  04/01/2015 5:19 PM  Type of Therapy:  Group Therapy  Participation Level: Invited, chose not to attend  Summary of Progress/Problems:  Chaplain led group explored concept of hope and its relevance to mental health recovery.  Patients explored themes including what matters to them personally, how others responses are similar/different, and what they are hopeful for.  Group members discussed relevance of social supports, innter strength and using their own stories to craft a recovery path.    Blake Mann, Blake Mann

## 2015-04-01 NOTE — Tx Team (Signed)
Interdisciplinary Treatment Plan Update (Adult)  Date:  04/01/2015   Time Reviewed:  7:49 AM   Progress in Treatment: Attending groups:No Participating in groups:  No Taking medication as prescribed:  Yes. Tolerating medication:  Yes. Family/Significant othe contact made:  No Patient understands diagnosis:  Yes  As evidenced by seeking help with psychosis, depression Discussing patient identified problems/goals with staff:  Yes, see initial care plan. Medical problems stabilized or resolved:  Yes. Denies suicidal/homicidal ideation: Yes. Issues/concerns per patient self-inventory:  No. Other:  New problem(s) identified:  Discharge Plan or Barriers: unknown  Reason for Continuation of Hospitalization: Psychosis, Depression, Medication Management   Comments:   Patient is a 54 year old African American male that reports SI with a plan to overdose. Patient reports that he has been feeling depressed and anxious since being incarcerated for 10 days in [place].   Patient reports that he has been off his medication. Patient reports that he cannot remember who prescribes his medication or the name of the facility when he got his medication. Patient reports that he does not remember the last time that he took his psychiatric medication. Patient reports that he is diagnosed with Bipolar Disorder and Schizophrenia.   Patient reports that he was in jail due to having a pocket knife. Patient would not elaborate on what he was planning to do with the pocket knife or why he had the pocket knife. Patient only stated that the pocket knife belonged to him.   Patient reported that he saw and heard voices and shadows telling him to hurt himself 3 days ago when he was in jail. Patient denied receiving any psychiatric medication while in jail.   Patient reports that he has been abusing cocaine for over 30 years. Patient reports that he uses every weekend when he gets paid. Patient reports that  he uses as much as he can. Patient was not able to tell me how he uses when he gets paid. Patient reports that his last use was after her was released from prison 2 days ago. Patient reports that he does not remember how much crack he used 2 days ago. Patient reports that he cannot go back to [place] to his apartment because he will use again.   Patient reports working at [employer] and denies having any family or social supports. Patient denies any history of seizures. Patient denies withdrawal symptoms. Patient reports substance abuse treatment in 2015 in [place].   Seroquel, Prozac trial  UDS negative  Estimated length of stay:  4-5 days  New goal(s):  Review of initial/current patient goals per problem list:     Attendees: Patient:  04/01/2015 7:49 AM   Family:   04/01/2015 7:49 AM   Physician:  Jomarie LongsSaramma Eappen, MD 04/01/2015 7:49 AM   Nursing:   Marzetta Boardhrista Dopson, RN 04/01/2015 7:49 AM   CSW:    Daryel Geraldodney Nattie Lazenby, LCSW   04/01/2015 7:49 AM   Other:  04/01/2015 7:49 AM   Other:   04/01/2015 7:49 AM   Other:  Onnie BoerJennifer Clark, Nurse CM 04/01/2015 7:49 AM   Other:  Leisa LenzValerie Enoch, [facility] 04/01/2015 7:49 AM   Other:  Tomasita Morrowelora Sutton, P4CC  04/01/2015 7:49 AM   Other:  04/01/2015 7:49 AM   Other:  04/01/2015 7:49 AM   Other:  04/01/2015 7:49 AM   Other:  04/01/2015 7:49 AM   Other:  04/01/2015 7:49 AM   Other:   04/01/2015 7:49 AM    Scribe for Treatment Team:  Ida Rogue, 04/01/2015 7:49 AM

## 2015-04-01 NOTE — H&P (Signed)
Psychiatric Admission Assessment Adult  Patient Identification: Blake Mann MRN:  [id number] Date of Evaluation:  04/01/2015 Chief Complaint:  "I have been suicidal for a long time."  Principal Diagnosis: Bipolar I disorder, most recent episode depressed Diagnosis:   Patient Active Problem List   Diagnosis Date Noted  . Bipolar I disorder, most recent episode depressed [F31.30] 04/01/2015  . Mood disorder [F39] 03/31/2015   History of Present Illness::  Blake Mann is a 54 year old African American male who presented to [facility] reporting SI with a plan to overdose. The patient was recently incarcerated in [place] for ten days. He reported being off his psychiatric medication for Bipolar Depression. Blake Mann has been very vague about the reasons he was in jail stating "I was extradited here. I got all that cleared up. Legally I am fine." Blake Mann has been off his medications for an unknown period of time, which prompted him to experience hearing voices and seeing shadows. 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 Blake Mann was observed laying in bed upon assessment today and was minimally involved. He did not make any eye contact and gave brief answers to the questions. Patient would not elaborate about why he was in [place]. He stated "I have been suicidal for a long time. I can't go back to [place]. I need to speak to the Education officer, museum. Maybe I can go to a halfway house." Blake Mann appears very depressed during the assessment but does not appear to be responding to internal stimuli. Patient reports that he is still suicidal with a plan. He reports a history of mood instability that has been effectively managed with Prozac/Seroquel. Patient contracts for his safety on the unit.   Elements:  Location:  depression, suicidal thoughts . Quality:  voices, decreased ability to function. Severity:  Severe . Timing:  last few days. Duration:  Chronic . Context:   recently came to [facility], off medications for several days. Associated Signs/Symptoms: Depression Symptoms:  depressed mood, anhedonia, psychomotor retardation, feelings of worthlessness/guilt, recurrent thoughts of death, suicidal thoughts with specific plan, anxiety, (Hypo) Manic Symptoms:  Irritable Mood, Labiality of Mood, Anxiety Symptoms:  Excessive Worry, Psychotic Symptoms:  Hallucinations: Auditory PTSD Symptoms: Negative Total Time spent with patient: 1 hour  Past Medical History:  Past Medical History  Diagnosis Date  . Bipolar 1 disorder   . Depressed bipolar disorder   . Anxiety   . Panic attacks   . Hypertension   . Hepatitis C   . Schizophrenia     Past Surgical History  Procedure Laterality Date  . None     Family History: History reviewed. No pertinent family history. Social History:  History  Alcohol Use  . Yes    Comment: occassionally     History  Drug Use  . 2.00 per week  . Special: Cocaine    Comment: Last used about 2 weeks ago     History   Social History  . Marital Status: Legally Separated    Spouse Name: N/A  . Number of Children: N/A  . Years of Education: N/A   Social History Main Topics  . Smoking status: Current Every Day Smoker -- 0.50 packs/day for 40 years    Types: Cigarettes  . Smokeless tobacco: Never Used  . Alcohol Use: Yes     Comment: occassionally  . Drug Use: 2.00 per week    Special: Cocaine     Comment: Last used about 2 weeks  ago   . Sexual Activity: Not Currently   Other Topics Concern  . None   Social History Narrative   Additional Social History:    Pain Medications: See home med list Prescriptions: See home med list Over the Counter: See home med list History of alcohol / drug use?: Yes Longest period of sobriety (when/how long): Most recent-9 days when in jail Negative Consequences of Use: Financial, Legal, Personal relationships Withdrawal Symptoms: Other (Comment) (none reported at  this time) Name of Substance 1: rack cocaine 1 - Age of First Use: early twenties 1 - Amount (size/oz): varies 1 - Frequency: every weekend 1 - Duration: ongoing 1 - Last Use / Amount: 03/29/15                   Musculoskeletal: Strength & Muscle Tone: within normal limits Gait & Station: normal Patient leans: N/A  Psychiatric Specialty Exam: Physical Exam  Constitutional:  Physical exam findings reviewed from the [facility] and I concur with no noted exceptions.   Psychiatric: His speech is normal. He is withdrawn. Cognition and memory are impaired. He expresses impulsivity. He exhibits a depressed mood. He expresses suicidal ideation. He is inattentive.    Review of Systems  Constitutional: Negative.   HENT: Negative.   Eyes: Negative.   Respiratory: Negative.   Cardiovascular: Negative.   Gastrointestinal: Negative.   Genitourinary: Negative.   Musculoskeletal: Negative.   Skin: Negative.   Neurological: Negative.   Endo/Heme/Allergies: Negative.   Psychiatric/Behavioral: Positive for depression, suicidal ideas, hallucinations and substance abuse. Negative for memory loss. The patient is nervous/anxious and has insomnia.     Blood pressure 105/64, pulse 84, temperature 98.4 F (36.9 C), temperature source Oral, resp. rate 18, height 5' 4.5" (1.638 m), weight 80.287 kg (177 lb), SpO2 100 %.Body mass index is 29.92 kg/(m^2).  General Appearance: Disheveled  Eye Sport and exercise psychologist::  None  Speech:  Clear and Coherent and Slow  Volume:  Decreased  Mood:  Dysphoric  Affect:  Restricted  Thought Process:  Goal Directed and Intact  Orientation:  Full (Time, Place, and Person)  Thought Content:  Symptoms, worries  Suicidal Thoughts:  Yes.  with intent/plan  Homicidal Thoughts:  No  Memory:  Immediate;   Fair Recent;   Fair Remote;   Poor  Judgement:  Poor  Insight:  Shallow  Psychomotor Activity:  Decreased  Concentration:  Fair  Recall:  [employer] of Knowledge:Fair   Language: Good  Akathisia:  No  Handed:  Right  AIMS (if indicated):     Assets:  Communication Skills Desire for Improvement Physical Health Resilience  ADL's:  Intact  Cognition: WNL  Sleep:  Number of Hours: 5.75   Risk to Self: Is patient at risk for suicide?: Yes Risk to Others:   Prior Inpatient Therapy:   Prior Outpatient Therapy:    Alcohol Screening: 1. How often do you have a drink containing alcohol?: Monthly or less 2. How many drinks containing alcohol do you have on a typical day when you are drinking?: 1 or 2 3. How often do you have six or more drinks on one occasion?: Never Preliminary Score: 0 4. How often during the last year have you found that you were not able to stop drinking once you had started?: Never 5. How often during the last year have you failed to do what was normally expected from you becasue of drinking?: Never 6. How often during the last year have you needed a first  drink in the morning to get yourself going after a heavy drinking session?: Never 7. How often during the last year have you had a feeling of guilt of remorse after drinking?: Never 8. How often during the last year have you been unable to remember what happened the night before because you had been drinking?: Never 9. Have you or someone else been injured as a result of your drinking?: No 10. Has a relative or friend or a doctor or another health worker been concerned about your drinking or suggested you cut down?: No Alcohol Use Disorder Identification Test Final Score (AUDIT): 1 Brief Intervention: AUDIT score less than 7 or less-screening does not suggest unhealthy drinking-brief intervention not indicated  Allergies:  No Known Allergies Lab Results:  Results for orders placed or performed during the hospital encounter of 03/31/15 (from the past 48 hour(s))  Urine rapid drug screen (hosp performed)     Status: None   Collection Time: 03/31/15  5:00 PM  Result Value Ref Range    Opiates NONE DETECTED NONE DETECTED   Cocaine NONE DETECTED NONE DETECTED   Benzodiazepines NONE DETECTED NONE DETECTED   Amphetamines NONE DETECTED NONE DETECTED   Tetrahydrocannabinol NONE DETECTED NONE DETECTED   Barbiturates NONE DETECTED NONE DETECTED    Comment:        DRUG SCREEN FOR MEDICAL PURPOSES ONLY.  IF CONFIRMATION IS NEEDED FOR ANY PURPOSE, NOTIFY LAB WITHIN 5 DAYS.        LOWEST DETECTABLE LIMITS FOR URINE DRUG SCREEN Drug Class       Cutoff (ng/mL) Amphetamine      1000 Barbiturate      200 Benzodiazepine   762 Tricyclics       263 Opiates          300 Cocaine          300 THC              50   CBC with Differential/Platelet     Status: None   Collection Time: 03/31/15  5:33 PM  Result Value Ref Range   WBC 5.9 4.0 - 10.5 K/uL   RBC 4.62 4.22 - 5.81 MIL/uL   Hemoglobin 13.2 13.0 - 17.0 g/dL   HCT 40.1 39.0 - 52.0 %   MCV 86.8 78.0 - 100.0 fL   MCH 28.6 26.0 - 34.0 pg   MCHC 32.9 30.0 - 36.0 g/dL   RDW 13.6 11.5 - 15.5 %   Platelets 242 150 - 400 K/uL   Neutrophils Relative % 56 43 - 77 %   Neutro Abs 3.2 1.7 - 7.7 K/uL   Lymphocytes Relative 36 12 - 46 %   Lymphs Abs 2.1 0.7 - 4.0 K/uL   Monocytes Relative 5 3 - 12 %   Monocytes Absolute 0.3 0.1 - 1.0 K/uL   Eosinophils Relative 2 0 - 5 %   Eosinophils Absolute 0.1 0.0 - 0.7 K/uL   Basophils Relative 1 0 - 1 %   Basophils Absolute 0.1 0.0 - 0.1 K/uL  Comprehensive metabolic panel     Status: None   Collection Time: 03/31/15  5:33 PM  Result Value Ref Range   Sodium 140 135 - 145 mmol/L   Potassium 4.5 3.5 - 5.1 mmol/L   Chloride 104 101 - 111 mmol/L   CO2 29 22 - 32 mmol/L   Glucose, Bld 87 65 - 99 mg/dL   BUN 14 6 - 20 mg/dL   Creatinine, Ser 1.16 0.61 -  1.24 mg/dL   Calcium 8.9 8.9 - 10.3 mg/dL   Total Protein 7.2 6.5 - 8.1 g/dL   Albumin 3.5 3.5 - 5.0 g/dL   AST 22 15 - 41 U/L   ALT 27 17 - 63 U/L   Alkaline Phosphatase 75 38 - 126 U/L   Total Bilirubin 0.4 0.3 - 1.2 mg/dL   GFR calc  non Af Amer >60 >60 mL/min   GFR calc Af Amer >60 >60 mL/min    Comment: (NOTE) The eGFR has been calculated using the CKD EPI equation. This calculation has not been validated in all clinical situations. eGFR's persistently <60 mL/min signify possible Chronic Kidney Disease.    Anion gap 7 5 - 15  Acetaminophen level     Status: Abnormal   Collection Time: 03/31/15  5:33 PM  Result Value Ref Range   Acetaminophen (Tylenol), Serum <10 (L) 10 - 30 ug/mL    Comment:        THERAPEUTIC CONCENTRATIONS VARY SIGNIFICANTLY. A RANGE OF 10-30 ug/mL MAY BE AN EFFECTIVE CONCENTRATION FOR MANY PATIENTS. HOWEVER, SOME ARE BEST TREATED AT CONCENTRATIONS OUTSIDE THIS RANGE. ACETAMINOPHEN CONCENTRATIONS >150 ug/mL AT 4 HOURS AFTER INGESTION AND >50 ug/mL AT 12 HOURS AFTER INGESTION ARE OFTEN ASSOCIATED WITH TOXIC REACTIONS.   Ethanol     Status: None   Collection Time: 03/31/15  5:33 PM  Result Value Ref Range   Alcohol, Ethyl (B) <5 <5 mg/dL    Comment:        LOWEST DETECTABLE LIMIT FOR SERUM ALCOHOL IS 5 mg/dL FOR MEDICAL PURPOSES ONLY   Salicylate level     Status: None   Collection Time: 03/31/15  5:33 PM  Result Value Ref Range   Salicylate Lvl <7.0 2.8 - 30.0 mg/dL   Current Medications: Current Facility-Administered Medications  Medication Dose Route Frequency Provider Last Rate Last Dose  . acetaminophen (TYLENOL) tablet 650 mg  650 mg Oral Q6H PRN Harriet Butte, NP   650 mg at 04/01/15 0848  . alum & mag hydroxide-simeth (MAALOX/MYLANTA) 200-200-20 MG/5ML suspension 30 mL  30 mL Oral Q4H PRN Harriet Butte, NP      . feeding supplement (ENSURE ENLIVE) (ENSURE ENLIVE) liquid 237 mL  237 mL Oral BID BM Nicholaus Bloom, MD      . FLUoxetine (PROZAC) capsule 20 mg  20 mg Oral Daily Harriet Butte, NP   20 mg at 04/01/15 0846  . hydrOXYzine (ATARAX/VISTARIL) tablet 50 mg  50 mg Oral TID PRN Harriet Butte, NP   50 mg at 04/01/15 0848  . magnesium hydroxide (MILK OF MAGNESIA)  suspension 30 mL  30 mL Oral Daily PRN Harriet Butte, NP      . nicotine (NICODERM CQ - dosed in mg/24 hours) patch 14 mg  14 mg Transdermal Daily Nicholaus Bloom, MD   14 mg at 04/01/15 0846  . [START ON 04/02/2015] pneumococcal 23 valent vaccine (PNU-IMMUNE) injection 0.5 mL  0.5 mL Intramuscular Tomorrow-1000 Nicholaus Bloom, MD      . QUEtiapine (SEROQUEL) tablet 300 mg  300 mg Oral QHS Harriet Butte, NP   300 mg at 03/31/15 2208  . traZODone (DESYREL) tablet 50 mg  50 mg Oral QHS PRN Harriet Butte, NP       PTA Medications: Prescriptions prior to admission  Medication Sig Dispense Refill Last Dose  . FLUoxetine (PROZAC) 20 MG capsule Take 20 mg by mouth daily.   Past Month at  Unknown time  . hydrOXYzine (ATARAX/VISTARIL) 50 MG tablet Take 50 mg by mouth 3 (three) times daily as needed.   Past Month at Unknown time  . QUEtiapine (SEROQUEL) 300 MG tablet Take 300 mg by mouth at bedtime.   Past Month at Unknown time  . amLODipine (NORVASC) 5 MG tablet Take 5 mg by mouth daily.   Past Week at Unknown time    Previous Psychotropic Medications: Yes  Wellbutrin, Lexapro, Depakote  Substance Abuse History in the last 12 months:  Yes.    Consequences of Substance Abuse: Patient's uds negative but reports long history of crack addiciton resulting in legal problems over the years.   Results for orders placed or performed during the hospital encounter of 03/31/15 (from the past 72 hour(s))  Urine rapid drug screen (hosp performed)     Status: None   Collection Time: 03/31/15  5:00 PM  Result Value Ref Range   Opiates NONE DETECTED NONE DETECTED   Cocaine NONE DETECTED NONE DETECTED   Benzodiazepines NONE DETECTED NONE DETECTED   Amphetamines NONE DETECTED NONE DETECTED   Tetrahydrocannabinol NONE DETECTED NONE DETECTED   Barbiturates NONE DETECTED NONE DETECTED    Comment:        DRUG SCREEN FOR MEDICAL PURPOSES ONLY.  IF CONFIRMATION IS NEEDED FOR ANY PURPOSE, NOTIFY LAB WITHIN 5  DAYS.        LOWEST DETECTABLE LIMITS FOR URINE DRUG SCREEN Drug Class       Cutoff (ng/mL) Amphetamine      1000 Barbiturate      200 Benzodiazepine   784 Tricyclics       696 Opiates          300 Cocaine          300 THC              50   CBC with Differential/Platelet     Status: None   Collection Time: 03/31/15  5:33 PM  Result Value Ref Range   WBC 5.9 4.0 - 10.5 K/uL   RBC 4.62 4.22 - 5.81 MIL/uL   Hemoglobin 13.2 13.0 - 17.0 g/dL   HCT 40.1 39.0 - 52.0 %   MCV 86.8 78.0 - 100.0 fL   MCH 28.6 26.0 - 34.0 pg   MCHC 32.9 30.0 - 36.0 g/dL   RDW 13.6 11.5 - 15.5 %   Platelets 242 150 - 400 K/uL   Neutrophils Relative % 56 43 - 77 %   Neutro Abs 3.2 1.7 - 7.7 K/uL   Lymphocytes Relative 36 12 - 46 %   Lymphs Abs 2.1 0.7 - 4.0 K/uL   Monocytes Relative 5 3 - 12 %   Monocytes Absolute 0.3 0.1 - 1.0 K/uL   Eosinophils Relative 2 0 - 5 %   Eosinophils Absolute 0.1 0.0 - 0.7 K/uL   Basophils Relative 1 0 - 1 %   Basophils Absolute 0.1 0.0 - 0.1 K/uL  Comprehensive metabolic panel     Status: None   Collection Time: 03/31/15  5:33 PM  Result Value Ref Range   Sodium 140 135 - 145 mmol/L   Potassium 4.5 3.5 - 5.1 mmol/L   Chloride 104 101 - 111 mmol/L   CO2 29 22 - 32 mmol/L   Glucose, Bld 87 65 - 99 mg/dL   BUN 14 6 - 20 mg/dL   Creatinine, Ser 1.16 0.61 - 1.24 mg/dL   Calcium 8.9 8.9 - 10.3 mg/dL   Total Protein 7.2  6.5 - 8.1 g/dL   Albumin 3.5 3.5 - 5.0 g/dL   AST 22 15 - 41 U/L   ALT 27 17 - 63 U/L   Alkaline Phosphatase 75 38 - 126 U/L   Total Bilirubin 0.4 0.3 - 1.2 mg/dL   GFR calc non Af Amer >60 >60 mL/min   GFR calc Af Amer >60 >60 mL/min    Comment: (NOTE) The eGFR has been calculated using the CKD EPI equation. This calculation has not been validated in all clinical situations. eGFR's persistently <60 mL/min signify possible Chronic Kidney Disease.    Anion gap 7 5 - 15  Acetaminophen level     Status: Abnormal   Collection Time: 03/31/15  5:33 PM   Result Value Ref Range   Acetaminophen (Tylenol), Serum <10 (L) 10 - 30 ug/mL    Comment:        THERAPEUTIC CONCENTRATIONS VARY SIGNIFICANTLY. A RANGE OF 10-30 ug/mL MAY BE AN EFFECTIVE CONCENTRATION FOR MANY PATIENTS. HOWEVER, SOME ARE BEST TREATED AT CONCENTRATIONS OUTSIDE THIS RANGE. ACETAMINOPHEN CONCENTRATIONS >150 ug/mL AT 4 HOURS AFTER INGESTION AND >50 ug/mL AT 12 HOURS AFTER INGESTION ARE OFTEN ASSOCIATED WITH TOXIC REACTIONS.   Ethanol     Status: None   Collection Time: 03/31/15  5:33 PM  Result Value Ref Range   Alcohol, Ethyl (B) <5 <5 mg/dL    Comment:        LOWEST DETECTABLE LIMIT FOR SERUM ALCOHOL IS 5 mg/dL FOR MEDICAL PURPOSES ONLY   Salicylate level     Status: None   Collection Time: 03/31/15  5:33 PM  Result Value Ref Range   Salicylate Lvl <5.4 2.8 - 30.0 mg/dL    Observation Level/Precautions:  15 minute checks  Laboratory:  CBC Chemistry Profile UDS  Psychotherapy:  Individual and Group Therapy  Medications:  Restart Prozac 20 mg daily for depressive symptoms, Seroquel 300 mg at bedtime for insomnia/psychosis/mood control   Consultations:  As needed  Discharge Concerns:  Medication compliance, stable housing   Estimated LOS: 3-7 days  Other:     Psychological Evaluations: Yes   Treatment Plan Summary: Daily contact with patient to assess and evaluate symptoms and progress in treatment and Medication management  Treatment Plan/Recommendations:   1. Admit for crisis management and stabilization. Estimated length of stay 5-7 days. 2. Medication management to reduce current symptoms to base line and improve the patient's level of functioning.  3. Develop treatment plan to decrease risk of relapse upon discharge of depressive symptoms and the need for readmission. 5. Group therapy to facilitate development of healthy coping skills to use for depression and anxiety. 6. Health care follow up as needed for medical problems. Patient's blood  pressure WNL off Hypertensive therapy. Will monitor for need to initiate Norvasc per his home medication list.  7. Discharge plan to include therapy to help patient cope with  stressors.  8. Call for Consult with Hospitalist for additional specialty patient services as needed.   Medical Decision Making:  Review of Psycho-Social Stressors (1), Review or order clinical lab tests (1), Order AIMS Test (2) and Established Problem, Worsening (2)  I certify that inpatient services furnished can reasonably be expected to improve the patient's condition.   Elmarie Shiley, NP-C 7/8/201611:47 AM I personally assessed the patient, reviewed the physical exam and labs and formulated the treatment plan Geralyn Flash A. Sabra Heck, M.D.

## 2015-04-01 NOTE — Progress Notes (Signed)
Psychoeducational Group Note  Date:  04/01/2015 Time:  2220  Group Topic/Focus:  Wrap-Up Group:   The focus of this group is to help patients review their daily goal of treatment and discuss progress on daily workbooks.  Participation Level: Did Not Attend  Participation Quality:  Not Applicable  Affect:  Not Applicable  Cognitive:  Not Applicable  Insight:  Not Applicable  Engagement in Group: Not Applicable  Additional Comments:  The patient did not attend group this evening.   Kaneisha Ellenberger S 04/01/2015, 10:20 PM

## 2015-04-01 NOTE — Progress Notes (Signed)
NUTRITION ASSESSMENT  Pt identified as at risk on the Malnutrition Screen Tool  INTERVENTION: 1. Educated patient on the importance of nutrition and encouraged intake of food and beverages. 2. Discussed weight goals. 3. Supplements: continue Ensure Enlive BID, each supplement provides 350 kcal and 20 grams of protein  NUTRITION DIAGNOSIS: Unintentional weight loss related to sub-optimal intake as evidenced by pt report.   Goal: Pt to meet >/= 90% of their estimated nutrition needs.  Monitor:  PO intake  Assessment:  Pt seen for MST. Pt was in jail for 10 days PTA and states he was off his medication for 9 days. He has hx of drug abuse.  Pt unsure of weight changes PTA. Limited weight hx available in chart with both recorded weights from yesterday differing.   Ensure Enlive has been ordered BID for pt.  54 y.o. male  Height: Ht Readings from Last 1 Encounters:  03/31/15 5' 4.5" (1.638 m)    Weight: Wt Readings from Last 1 Encounters:  03/31/15 177 lb (80.287 kg)    Weight Hx: Wt Readings from Last 10 Encounters:  03/31/15 177 lb (80.287 kg)  03/31/15 185 lb (83.915 kg)    BMI:  Body mass index is 29.92 kg/(m^2). Pt meets criteria for overweight status based on current BMI.  Estimated Nutritional Needs: Kcal: 25-30 kcal/kg Protein: > 1 gram protein/kg Fluid: 1 ml/kcal  Diet Order: Diet regular Room service appropriate?: Yes; Fluid consistency:: Thin Pt is also offered choice of unit snacks mid-morning and mid-afternoon.  Pt is eating as desired.   Lab results and medications reviewed.    Trenton GammonJessica Ceniya Fowers, RD, LDN Inpatient Clinical Dietitian Pager # [phone] After hours/weekend pager # [phone]

## 2015-04-01 NOTE — Progress Notes (Signed)
Vol admit, 54 yo AA male, requesting help getting off of crack cocaine.  He says he has been having suicidal thoughts and needs to get back on his medications for schizophrenia.  Pt has been using cocaine for at least 30 years.  Pt says he just got out of jail a few days ago for having an open knife in his pocket.  He says he hears voices at times and sees shadows, but is not hearing any voices during the admission.  He says he was living in [place] for 9 yrs in an apartment, but cannot return there because he would be around the same people and start using again.  He reports he has family in [place], but wants help finding housing in this area.  Pt was polite and cooperative with the admission process.  Search completed and paperwork signed.  Pt was oriented to unit and room.  Safety checks q15 minutes initiated.

## 2015-04-01 NOTE — Progress Notes (Signed)
Blake Mann is isolative, quiet and has kept to himself f all day long. He endorses feelings of SI today, when he completes his daily assessment but he is willing to contract for safety with this Clinical research associatewriter. HE has a flat, blunted affect and he avoids eye contact. He is unkempt. Has poor body hygiene. All he will offer is that he has been in jail and " needs to get back on my meds".   A He has attended his meals in the cafe and so far is integrating on the unit without diff.    R Safety is in place and poc cont.

## 2015-04-02 MED ORDER — HYDROXYZINE HCL 50 MG PO TABS
ORAL_TABLET | ORAL | Status: AC
  Filled 2015-04-02: qty 1

## 2015-04-02 MED ORDER — HYDROXYZINE HCL 50 MG PO TABS
50.0000 mg | ORAL_TABLET | Freq: Once | ORAL | Status: AC
  Administered 2015-04-02: 50 mg via ORAL
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
## 2015-04-02 NOTE — [facility] Group Notes (Signed)
[facility] Group Notes:  (Clinical Social Work)  04/02/2015  11:15-12:00PM  Summary of Progress/Problems:   The main focus of today's process group was to discuss patients' feelings related to being hospitalized.  It was agreed in general by the group that it would be preferable to avoid future hospitalizations, and we discussed means of doing that.  As a follow-up, problems with adhering to medication recommendations were discussed.  The patient expressed their primary feeling about being hospitalized is that he is "okay with it" because when he was previously hospitalized, he was supposed to stay another week, but insisted loudly on discharge and found out he was not really stable yet, should have stayed longer.  He stated that an issue he has had is substance abuse and stopping his medications while drinking, and he cautioned other patients about this.  Type of Therapy:  Group Therapy - Process  Participation Level:  Active  Participation Quality:  Attentive, Sharing and Supportive  Affect:  Blunted and Depressed  Cognitive:  Appropriate  Insight:  Engaged  Engagement in Therapy:  Engaged  Modes of Intervention:  Exploration, Discussion  Ambrose MantleMareida Grossman-Orr, LCSW 04/02/2015, 12:36 PM

## 2015-04-02 NOTE — Plan of Care (Signed)
Problem: Diagnosis: Increased Risk For Suicide Attempt Goal: STG-Patient Will Comply With Medication Regime Outcome: Progressing Patient is compliant with scheduled medication.     

## 2015-04-02 NOTE — [facility] Group Notes (Signed)
[facility] Group Notes:  Healthy coping skills  Date:  04/02/2015  Time:  10:00  Type of Therapy:  Nurse Education  Participation Level:  Active  Participation Quality:  Appropriate  Affect:  Appropriate  Cognitive:  Appropriate  Insight:  Appropriate  Engagement in Group:  Engaged  Modes of Intervention:  Discussion  Summary of Progress/Problems:  Nicole CellaWebb, Idrees Quam Guyes 04/02/2015, 1:47 PM

## 2015-04-03 MED ORDER — BUSPIRONE HCL 10 MG PO TABS
10.0000 mg | ORAL_TABLET | Freq: Two times a day (BID) | ORAL | Status: DC
  Administered 2015-04-03 – 2015-04-06 (×6): 10 mg via ORAL
  Filled 2015-04-03 (×8): qty 1

## 2015-04-03 NOTE — [facility] Group Notes (Signed)
[facility] Group Notes:  (Clinical Social Work)  04/03/2015  [facility] Group Notes:  (Clinical Social Work)  04/03/2015  11:00AM-12:00PM  Summary of Progress/Problems:  The main focus of today's process group was to listen to a variety of genres of music and to identify that different types of music provoke different responses.  The patient then was able to identify personally what was soothing for them, as well as energizing.  Handouts were used to record feelings evoked, as well as how patient can personally use this knowledge in sleep habits, with depression, and with other symptoms.  The patient expressed understanding of concepts, as well as knowledge of how each type of music affected him and how this can be used at home as a wellness/recovery tool.  While he showed no facial changes, his feet would tap to the music and he would mouth the words.  Type of Therapy:  Music Therapy   Participation Level:  Active  Participation Quality:  Attentive  Affect:  Flat and Depressed  Cognitive:  Oriented  Insight:  Engaged  Engagement in Therapy:  Engaged  Modes of Intervention:   Activity, Exploration  Ambrose MantleMareida Grossman-Orr, LCSW 04/03/2015

## 2015-04-03 NOTE — Progress Notes (Signed)
D: Blake Mann admits passive SI but contracts for safety. He denies HI and AVH. He complained of a headache of a 7. He appears blunted and depressed. He has remained in his room with eyes closed for much of the shift.  A: Meds given as ordered, including Tylenol for HA, with relief. Q15 safety checks maintained. Support/encouragement offered. R: Pt remains free from harm and continues with treatment. Will continue to monitor for needs/safety.

## 2015-04-03 NOTE — Progress Notes (Signed)
Writer has observed patient in his room resting, he did not attend group this evening and writer encouraged him to come take his medications and snack. He is irritable and does not care to talk with Clinical research associatewriter. He reports passive si and verbally contracts, denies hi/a/v hallucinations. Writer asked if he had a goal for discharge and he reports that he has not thought about it yet. Safety maintained on unit with 15 min checks.

## 2015-04-03 NOTE — Progress Notes (Signed)
Writer has observed patient isolative to his room other than coming to dayroom for snackl and to take his medications. He reported to Clinical research associatewriter, "I'm just stressed out" but did not elaborate on what was causing him stress. He seemed to become agitated the more I attempted to talk with him. He denies hi/ a/v hallucinations but reports passive si and verbally contract for safety. Support given, safety maintained on unit with 15 min checks.
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
## 2015-04-03 NOTE — [facility] Group Notes (Signed)
[facility] Group Notes:  (Nursing/MHT/Case Management/Adjunct)  Date:  04/03/2015  Time:  1000  Type of Therapy:  Nurse Education  Participation Level:  Active  Participation Quality:  Appropriate  Affect:  Depressed  Cognitive:  Appropriate and Oriented  Insight:  Improving  Engagement in Group:  Engaged  Modes of Intervention:  Discussion, Education and Support  Summary of Progress/Problems: [name] was quiet but appropriate during group.   Maurine SimmeringShugart, Roderick Sweezy M 04/03/2015, 11:25 AM

## 2015-04-04 NOTE — Progress Notes (Signed)
Did not attend group 
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
## 2015-04-04 NOTE — Clinical Social Work Note (Signed)
Updated clinicals faxed to [facility] =[id number],  good for 7 days, beginning today.  Santa GeneraAnne Lycan Davee, LCSW Clinical Social Worker

## 2015-04-04 NOTE — [facility] Group Notes (Signed)
[facility]   04/04/2015 8:45 AM  Participation Quality:  Alert, Appropriate and Oriented  Mood/Affect:  Flat, depressed  Depression Rating:  8  Anxiety Rating:  8  Thoughts of Suicide:  Pt denies SI/HI  Will you contract for safety?   Yes  Current AVH:  Pt denies  Plan for Discharge/Comments:  Pt attended discharge planning group and actively participated in group.  CSW provided pt with today's workbook.  Pt reports a plan to go to further inpatient treatment.  Pt states that previous CSW referred him to [facility]  CSW will follow up with these referrals.  No further needs voiced by pt at this time.    Transportation Means: Pt will need assistance  Supports: No supports mentioned at this time  Reyes IvanChelsea Horton, LCSW 04/04/2015 9:49 AM

## 2015-04-04 NOTE — Tx Team (Signed)
Interdisciplinary Treatment Plan Update (Adult)  Date: 04/04/2015  Time Reviewed:  9:45 AM  Progress in Treatment: Attending groups: Yes Participating in groups:  Yes Taking medication as prescribed:  Yes Tolerating medication:  Yes Family/Significant othe contact made: No, pt refused Patient understands diagnosis:  Yes Discussing patient identified problems/goals with staff:  Yes Medical problems stabilized or resolved:  Yes Denies suicidal/homicidal ideation: Yes Issues/concerns per patient self-inventory:  Yes Other:  New problem(s) identified: N/A  Discharge Plan or Barriers: CSW assessing for appropriate referrals.  [facility] referral made last week, [facility]    Reason for Continuation of Hospitalization: Anxiety Depression Medication Stabilization  Comments: RN reports pt is irritable with passive SI.    Estimated length of stay: 2-3 days  For review of initial/current patient goals, please see plan of care.  Attendees: Patient:     Family:     Physician:  Dr. Tawni CarnesSaranga 04/04/2015 9:55 AM   Nursing:   Waynetta SandyJan Wright, RN 04/04/2015 9:55 AM   Clinical Social Worker:  Reyes Ivanhelsea Horton, LCSW 04/04/2015 9:55 AM   Other: Sherryl Mangeslivette Wesseh, RN 04/04/2015 9:55 AM   Other:     Other:     Other:     Other:    Other:    Other:    Other:    Other:    Other:     Scribe for Treatment Team:   Carmina MillerHorton, Taishawn Smaldone Nicole, 04/04/2015 9:55 AM

## 2015-04-04 NOTE — [facility] Group Notes (Signed)
[facility] LCSW Group Therapy  04/04/2015   1:15 PM   Type of Therapy:  Group Therapy  Participation Level:  Active  Participation Quality:  Attentive, Sharing and Supportive  Affect:  Depressed and Flat  Cognitive:  Alert and Oriented  Insight:  Developing/Improving and Engaged  Engagement in Therapy:  Developing/Improving and Engaged  Modes of Intervention:  Clarification, Confrontation, Discussion, Education, Exploration, Limit-setting, Orientation, Problem-solving, Rapport Building, Dance movement psychotherapisteality Testing, Socialization and Support  Summary of Progress/Problems: Pt identified obstacles faced currently and processed barriers involved in overcoming these obstacles. Pt identified steps necessary for overcoming these obstacles and explored motivation (internal and external) for facing these difficulties head on. Pt further identified one area of concern in their lives and chose a goal to focus on for today.  Pt shared that his biggest obstacle is himself as he has let alcohol take over his life.  Pt states that he plans to follow recommendations this time instead of pushing to discharge, in order to better his life.  Pt actively participated and was engaged in group discussion.    Blake IvanChelsea Horton, LCSW 04/04/2015  3:25 PM

## 2015-04-04 NOTE — Progress Notes (Signed)
D:Pt c/o anxiety this morning with no hallucinations. Pt attended morning group and states that he is looking at [facility] and [facility] following discharge.  A:Offered support, encouragement and 15 minute checks. R:Safety maintained on the unit.

## 2015-04-04 NOTE — Clinical Social Work Note (Signed)
[facility] referral made today.  [facility] received referral last week and requests updated clinicals.  CSW will continue to monitor referral.   Blake IvanChelsea Horton, LCSW 04/04/2015  12:20 PM

## 2015-04-04 NOTE — Progress Notes (Signed)
Patient in bed at the beginning of this shift. He appeared sad and depressed. He reported that he was feeling agitated. Patient sated that he has not been sleeping well, he endorsed problem staying asleep. He also said he has been hearing voices telling him; "I will get you, it's your fault" and also seeing shadows. Writer encouraged and supported patient, told patient PA would be notified to make adjustment to patient's medications. Q 15 minute checks continues as ordered to maintain safety.

## 2015-04-04 NOTE — Plan of Care (Signed)
Problem: Ineffective individual coping Goal: STG: Patient will remain free from self harm Outcome: Progressing Pt denies si thoughts this morning.

## 2015-04-05 DIAGNOSIS — F1410 Cocaine abuse, uncomplicated: Secondary | ICD-10-CM

## 2015-04-05 MED ORDER — NICOTINE 14 MG/24HR TD PT24: 14.0000 mg | MEDICATED_PATCH | Freq: Every day | TRANSDERMAL | Status: AC

## 2015-04-05 MED ORDER — ENSURE ENLIVE PO LIQD: 237.0000 mL | Freq: Two times a day (BID) | ORAL | Status: AC

## 2015-04-05 MED ORDER — HYDROXYZINE HCL 50 MG PO TABS: 50.0000 mg | ORAL_TABLET | Freq: Three times a day (TID) | ORAL | Status: AC | PRN

## 2015-04-05 MED ORDER — TRAZODONE HCL 50 MG PO TABS: 50.0000 mg | ORAL_TABLET | Freq: Every evening | ORAL | Status: AC | PRN

## 2015-04-05 MED ORDER — AMLODIPINE BESYLATE 5 MG PO TABS: 5.0000 mg | ORAL_TABLET | Freq: Every day | ORAL | Status: AC

## 2015-04-05 MED ORDER — FLUOXETINE HCL 20 MG PO CAPS: 20.0000 mg | ORAL_CAPSULE | Freq: Every day | ORAL | Status: AC

## 2015-04-05 MED ORDER — BUSPIRONE HCL 10 MG PO TABS: 10.0000 mg | ORAL_TABLET | Freq: Two times a day (BID) | ORAL | Status: AC

## 2015-04-05 MED ORDER — MAGNESIUM HYDROXIDE 400 MG/5ML PO SUSP: 30.0000 mL | Freq: Every day | ORAL | Status: AC | PRN

## 2015-04-05 MED ORDER — QUETIAPINE FUMARATE 300 MG PO TABS: 300.0000 mg | ORAL_TABLET | Freq: Every day | ORAL | Status: AC
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
## 2015-04-05 NOTE — [facility] Suicide Risk Assessment (Signed)
[facility]   Demographic Factors:  Male  Total Time spent with patient: 30 minutes  Musculoskeletal: Strength & Muscle Tone: within normal limits Gait & Station: normal Patient leans: normal  Psychiatric Specialty Exam: Physical Exam  Review of Systems  Constitutional: Negative.   HENT: Negative.   Eyes: Negative.   Respiratory: Negative.   Cardiovascular: Negative.   Gastrointestinal: Negative.   Genitourinary: Negative.   Musculoskeletal: Negative.   Skin: Negative.   Neurological: Negative.   Endo/Heme/Allergies: Negative.   Psychiatric/Behavioral: Positive for depression and substance abuse. The patient is nervous/anxious.     Blood pressure 146/83, pulse 59, temperature 98.2 F (36.8 C), temperature source Oral, resp. rate 18, height 5' 4.5" (1.638 m), weight 80.287 kg (177 lb), SpO2 100 %.Body mass index is 29.92 kg/(m^2).  General Appearance: Fairly Groomed  Patent attorneyye Contact::  Fair  Speech:  Clear and Coherent409  Volume:  Normal  Mood:  Anxious  Affect:  anxious  Thought Process:  Coherent and Goal Directed  Orientation:  Full (Time, Place, and Person)  Thought Content:  symptoms events worries concerns  Suicidal Thoughts:  No  Homicidal Thoughts:  No  Memory:  Immediate;   Fair Recent;   Fair Remote;   Fair  Judgement:  Fair  Insight:  Present  Psychomotor Activity:  Restlessness  Concentration:  Fair  Recall:  [employer]  Fund of Knowledge:Fair  Language: Fair  Akathisia:  No  Handed:  Right  AIMS (if indicated):     Assets:  Desire for Improvement  Sleep:  Number of Hours: 5.75  Cognition: WNL  ADL's:  Intact   Have you used any form of tobacco in the last 30 days? (Cigarettes, Smokeless Tobacco, Cigars, and/or Pipes): Yes  Has this patient used any form of tobacco in the last 30 days? (Cigarettes, Smokeless Tobacco, Cigars, and/or Pipes) Yes, Prescription not provided because: will be admitted to [facility]  Mental Status Per  Nursing Assessment::   On Admission:  Suicidal ideation indicated by patient, Self-harm thoughts  Current Mental Status by Physician: In full contact with reality. There are no active S/S of withdrawal. There are no active SI plans or intent. He is willing and motivated to continue to work on long term abstinence as well as on his issues when he gets to [facility]   Loss Factors: Traumatic loss of a friend  Historical Factors: NA  Risk Reduction Factors:   NA  Continued Clinical Symptoms:  Bipolar Disorder:   Depressive phase Alcohol/Substance Abuse/Dependencies  Cognitive Features That Contribute To Risk:  Closed-mindedness, Polarized thinking and Thought constriction (tunnel vision)    Suicide Risk:  Moderate:  Frequent suicidal ideation with limited intensity, and duration, some specificity in terms of plans, no associated intent, good self-control, limited dysphoria/symptomatology, some risk factors present, and identifiable protective factors, including available and accessible social support.  Principal Problem: Bipolar I disorder, most recent episode depressed Discharge Diagnoses:  Patient Active Problem List   Diagnosis Date Noted  . Bipolar I disorder, most recent episode depressed [F31.30] 04/01/2015  . Cocaine abuse [F14.10] 04/01/2015      Plan Of Care/Follow-up recommendations:  Activity:  as tolerated Diet:  regular Will be admitted to [facility] Is patient on multiple antipsychotic therapies at discharge:  No   Has Patient had three or more failed trials of antipsychotic monotherapy by history:  No  Recommended Plan for Multiple Antipsychotic Therapies: NA    Blake Mann A 04/05/2015, 8:46 PM

## 2015-04-05 NOTE — [facility] Group Notes (Signed)
[facility] Group Notes:  (Nursing/MHT/Case Management/Adjunct)  Date:  04/05/2015  Time:  0900am  Type of Therapy:  Nurse Education  Participation Level:  Did Not Attend  Participation Quality:  Did not attend  Affect:  Did not attend  Cognitive:  Did not attend  Insight:  None  Engagement in Group:  Did not attend  Modes of Intervention:  Discussion, Education and Support  Summary of Progress/Problems: Patient was invited to group. Pt did not attend group and remained in bed resting.  Lendell CapriceGuthrie, Alfreida Steffenhagen A 04/05/2015, 10:48 AM

## 2015-04-05 NOTE — Progress Notes (Signed)
Patient in bed at the beginning of the shift. His mood and affect blunted and depressed. He reported that he had a good day said he was aware of his discharge tomorrow. Patient denied SI/HI and denied Hallucinations. He requested for Vistaril with his HS medication. Writer encouraged and supported patient. Q 15 minute check continues as ordered for safety.

## 2015-04-05 NOTE — Plan of Care (Signed)
Problem: Ineffective individual coping Goal: STG: Patient will remain free from self harm Outcome: Progressing Patient remains free from self harm. 15 minute checks continued per protocol for patient safety.   Problem: Diagnosis: Increased Risk For Suicide Attempt Goal: STG-Patient Will Attend All Groups On The Unit Outcome: Progressing Patient is attending some unit groups today. Goal: STG-Patient Will Comply With Medication Regime Outcome: Progressing Patient has adhered to medication regimen today with ease.  Problem: Alteration in mood Goal: LTG-Patient reports reduction in suicidal thoughts (Patient reports reduction in suicidal thoughts and is able to verbalize a safety plan for whenever patient is feeling suicidal)  Outcome: Progressing Patient denies having any suicidal thoughts today.

## 2015-04-05 NOTE — Clinical Social Work Note (Addendum)
CSW left message for Sgt Paschal [phone] cell) to arrange transportation.  Requested call back, will call back per dispatcher.  Also asked them to [facility] to coordinate [facility]. CSW spoke w Sgt Paschal.  Approximates that they can pick up pt at 8:30 - 9 AM so patient will arrive before lunch time.  [facility] VM documenting time papers were served on patient.        Santa GeneraAnne Cunningham, LCSW Clinical Social Worker

## 2015-04-05 NOTE — Progress Notes (Signed)
Did not attend group 

## 2015-04-05 NOTE — [facility] Group Notes (Signed)
  04/05/2015 2:53 PM  Type of Therapy:  Group Therapy  Participation Level:  Active  Participation Quality:  Engaged but quiet  Affect:  Appropriate  Cognitive:  Alert  Insight:  Developing/Improving  Engagement in Therapy:  Developing/Improving  Modes of Intervention:  Discussion, Exploration and Problem-solving  Summary of Progress/Problems:  CSW facilitated group on Feelings about Diagnosis.  Patients were encouraged to explore their reactions to being diagnosed w mental illness, labels, stigma and expectations of self and others post diagnosis.  Patients explored constructive use of self to develop greater insight and resiliency regarding recovery from mental illness.  Patient identified w negative perceptions of others about his mental health diagnosis, felt that diagnosis often resulted in negative behaviors as other mistrusted or devalued him.    Sallee LangeCunningham, Anne C 04/05/2015, 2:53 PM

## 2015-04-05 NOTE — Discharge Instructions (Signed)
°  [facility] Liv( [facility]) in-patient, 14 day substance abuse treatment facility  [address] [place], [place] [address] [phone]

## 2015-04-05 NOTE — Discharge Summary (Signed)
Physician Discharge Summary Note  Patient:  Blake Mann is an 54 y.o., male MRN:  [id number] DOB:  1961/06/03 Patient phone:  [phone] (home)  Patient address:   [address] [place] [address],  Total Time spent with patient: Greater than 30 minutes  Date of Admission:  03/31/2015 Date of Discharge: 04-06-15  Reason for Admission:  Mood stabilization treatment.  Principal Problem: Bipolar I disorder, most recent episode depressed Discharge Diagnoses: Patient Active Problem List   Diagnosis Date Noted  . Bipolar I disorder, most recent episode depressed [F31.30] 04/01/2015  . Cocaine abuse [F14.10] 04/01/2015   Musculoskeletal: Strength & Muscle Tone: within normal limits Gait & Station: normal Patient leans: N/A  Psychiatric Specialty Exam: Physical Exam  Respiratory: No stridor.  Psychiatric: His speech is normal and behavior is normal. Judgment and thought content normal. His mood appears not anxious. His affect is not angry, not blunt and not labile. Cognition and memory are normal. He does not exhibit a depressed mood.    Review of Systems  Constitutional: Negative.   HENT: Negative.   Eyes: Negative.   Respiratory: Negative.   Cardiovascular: Negative.   Gastrointestinal: Negative.   Genitourinary: Negative.   Musculoskeletal: Negative.   Skin: Negative.   Neurological: Negative.   Endo/Heme/Allergies: Negative.   Psychiatric/Behavioral: Positive for depression (Stable) and substance abuse (Cocaine dependence). Negative for suicidal ideas, hallucinations and memory loss. The patient has insomnia (Stable). The patient is not nervous/anxious.     Blood pressure 146/83, pulse 59, temperature 98.2 F (36.8 C), temperature source Oral, resp. rate 18, height 5' 4.5" (1.638 m), weight 80.287 kg (177 lb), SpO2 100 %.Body mass index is 29.92 kg/(m^2).  See Md's SRA   Have you used any form of tobacco in the last 30 days? (Cigarettes, Smokeless Tobacco, Cigars,  and/or Pipes): Yes  Has this patient used any form of tobacco in the last 30 days? (Cigarettes, Smokeless Tobacco, Cigars, and/or Pipes): Yes, Prescription not provided because: will be admitted to [facility].  Past Medical History:  Past Medical History  Diagnosis Date  . Bipolar 1 disorder   . Depressed bipolar disorder   . Anxiety   . Panic attacks   . Hypertension   . Hepatitis C   . Schizophrenia     Past Surgical History  Procedure Laterality Date  . None     Family History: History reviewed. No pertinent family history.  Social History:  History  Alcohol Use  . Yes    Comment: occassionally     History  Drug Use  . 2.00 per week  . Special: Cocaine    Comment: Last used about 2 weeks ago     History   Social History  . Marital Status: Legally Separated    Spouse Name: N/A  . Number of Children: N/A  . Years of Education: N/A   Social History Main Topics  . Smoking status: Current Every Day Smoker -- 0.50 packs/day for 40 years    Types: Cigarettes  . Smokeless tobacco: Never Used  . Alcohol Use: Yes     Comment: occassionally  . Drug Use: 2.00 per week    Special: Cocaine     Comment: Last used about 2 weeks ago   . Sexual Activity: Not Currently   Other Topics Concern  . None   Social History Narrative   Risk to Self: Is patient at risk for suicide?: Yes What has been your use of drugs/alcohol within the last 12 months?:  Was sober for approx 28 months after stay at [facility]), relapsed on crack approx 2 months ago.  Had 30 year history of cocain abuse  Risk to Others: No Prior Inpatient Therapy: Yes Prior Outpatient Therapy: Yes  Level of Care:  OP  Hospital Course:  Blake Mann is a 54 year old African American male who presented to [facility] reporting SI with a plan to overdose. The patient was recently incarcerated in [place] for ten days. He reported being off his psychiatric medication for Bipolar Depression.  Blake Mann has been very vague about the reasons he was in jail stating "I was extradited here. I got all that cleared up. Legally I am fine." Blake Mann has been off his medications for an unknown period of time, which prompted him to experience hearing voices and seeing shadows. 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 [facility] was observed laying in bed upon assessment today and was minimally involved. He did not make any eye contact and gave brief answers to the questions. Patient would not elaborate about why he was in [facility]. He stated "I have been suicidal for a long time. I can't go back to [facility].  Blake [facility] was admitted to the [facility] adult unit for suicidal ideations associated with worsening symptoms of bipolar disorder related to being off of his medicines for an unknown period of time. He also admitted having been abusing cocaine within the last 30 days as well. During his admission assessment, he was evaluated and his presenting symptoms were identified. Medication management was discussed and initiated targeting those presenting symptoms. He was oriented to the unit and encouraged to participate in the unit programming. His other pre-existing medical problems were identified and treated appropriately by resuming his pertinent home medication for those health issues.  During his hospital stay, Blake Mann was evaluated each day by a clinical provider to assess his response to his treatment regimen. Medication changes & adjustments were made according to need. As the day goes by, improvement was noted as evidenced by his report of decreasing symptoms, improved sleep, affect, medication tolerance, behavior, and participation in the unit programming. He was required on daily basis to complete a self inventory asssessment noting mood, mental status, pain, any new symptoms, anxiety and concerns. His symptoms responded well to his treatment regimen, being in a therapeutic and  supportive environment also assisted in his mood stability. Blake Mann did present appropriate behavior & was motivated for recovery. He worked closely with the treatment team and case manager to develop a discharge plan with appropriate goals to maintain mood stability after discharge. Coping skills, problem solving as well as relaxation therapies were also part of the unit programming.  On this day of his discharge, Alani is in much improved condition than upon admission. His symptoms were reported as significantly decreased or resolved completely. Upon discharge, he denies SI/HI and voiced no AVH. He was motivated to continue taking medication with a goal of continued improvement in mental health. He was medicated & discharge on; Seroquel 300 mg for mood control, Prozac 20 mg for depression, Buspar 10 mg for anxiety, Hydroxyzine 50 mg for anxiety & Trazodone 50 mg for insomnia. He is discharged to continue psychiatric care & substance abuse treatment at the [facility] in [place], [place]. He is provided with all the necessary information needed to make this appointment without problems. He left [facility] in no apparent distress. Transportation per [employer].  Consults:  psychiatry  Significant Diagnostic Studies:  labs: CBC with diff, CMP, UDS, toxicology tests, U/A, results reviewed, stable  Discharge Vitals:   Blood pressure 146/83, pulse 59, temperature 98.2 F (36.8 C), temperature source Oral, resp. rate 18, height 5' 4.5" (1.638 m), weight 80.287 kg (177 lb), SpO2 100 %. Body mass index is 29.92 kg/(m^2). Lab Results:   No results found for this or any previous visit (from the past 72 hour(s)).  Physical Findings: AIMS: Facial and Oral Movements Muscles of Facial Expression: None, normal Lips and Perioral Area: None, normal Jaw: None, normal Tongue: None, normal,Extremity Movements Upper (arms, wrists, hands, fingers): None, normal Lower (legs, knees, ankles, toes): None, normal, Trunk  Movements Neck, shoulders, hips: None, normal, Overall Severity Severity of abnormal movements (highest score from questions above): None, normal Incapacitation due to abnormal movements: None, normal Patient's awareness of abnormal movements (rate only patient's report): No Awareness, Dental Status Current problems with teeth and/or dentures?: No Does patient usually wear dentures?: No  CIWA:    COWS:  COWS Total Score: 1   See Psychiatric Specialty Exam and Suicide Risk Assessment completed by Attending Physician prior to discharge.  Discharge destination:  [facility]  Is patient on multiple antipsychotic therapies at discharge:  No   Has Patient had three or more failed trials of antipsychotic monotherapy by history:  No  Recommended Plan for Multiple Antipsychotic Therapies: NA    Medication List    TAKE these medications      Indication   amLODipine 5 MG tablet  Commonly known as:  NORVASC  Take 1 tablet (5 mg total) by mouth daily. For HTN   Indication:  High Blood Pressure     busPIRone 10 MG tablet  Commonly known as:  BUSPAR  Take 1 tablet (10 mg total) by mouth 2 (two) times daily. For anxiety   Indication:  Generalized Anxiety Disorder     feeding supplement (ENSURE ENLIVE) Liqd  Take 237 mLs by mouth 2 (two) times daily between meals. For nutritional supplement   Indication:  Nutritional supplement     FLUoxetine 20 MG capsule  Commonly known as:  PROZAC  Take 1 capsule (20 mg total) by mouth daily. For depression   Indication:  Major Depressive Disorder     hydrOXYzine 50 MG tablet  Commonly known as:  ATARAX/VISTARIL  Take 1 tablet (50 mg total) by mouth 3 (three) times daily as needed for anxiety.   Indication:  Anxiety     magnesium hydroxide 400 MG/5ML suspension  Commonly known as:  MILK OF MAGNESIA  Take 30 mLs by mouth daily as needed for mild constipation.   Indication:  Constipation     nicotine 14 mg/24hr patch  Commonly known as:  NICODERM CQ  - dosed in mg/24 hours  Place 1 patch (14 mg total) onto the skin daily. For nicotine addiction   Indication:  Nicotine Addiction     QUEtiapine 300 MG tablet  Commonly known as:  SEROQUEL  Take 1 tablet (300 mg total) by mouth at bedtime. For mood control   Indication:  Mood control     traZODone 50 MG tablet  Commonly known as:  DESYREL  Take 1 tablet (50 mg total) by mouth at bedtime as needed for sleep (May repeat x1). For sleep   Indication:  Trouble Sleeping       Follow-up recommendations: Activity:  As tolerated Diet: As recommended by your primary care doctor. Keep all scheduled follow-up appointments as recommended.  Comments: Take all your medications as prescribed by your mental healthcare provider. Report any adverse effects and or reactions from your medicines to your outpatient provider promptly. Patient is instructed and cautioned to not engage in alcohol and or illegal drug use while on prescription medicines. In the event of worsening symptoms, patient is instructed to call the crisis hotline, 911 and or go to the nearest ED for appropriate evaluation and treatment of symptoms. Follow-up with your primary care provider for your other medical issues, concerns and or health care needs.   Total Discharge Time: Greater than 30 minutes  Signed: Sanjuana Kava, PMHNp, FNP-BC 04/05/2015, 4:44 PM  I personally assessed the patient and formulated the plan Madie Reno A. Dub Mikes, M.D.

## 2015-04-05 NOTE — Progress Notes (Signed)
D: Patient is alert and oriented. Pt's mood and affect is depressed, anxious, flat, and sullen. Pt denies SI/HI and AVH. Pt reports he is not seeing "shadows" today that he has been seeing recently. Pt rates depression 10/10, hopelessness and anxiety both 9/10. Pt reports his goal for the day is "talk with the doc." Pt experiencing HTN, denies symptoms (See Docflowsheet-vitals). Pt C/O anxiety this morning which decreased with PRN medication. Pt C/O chronic lower back pain 7/10 this morning which decreased with PRN medication. Pt is not attending unit groups today. A: Active listening by RN. Encouragement/Support provided to pt. Will reassess/monitor BP frequently. Medication education reviewed with pt. PRN medication administered for anxiety and pain per providers orders (See MAR). Scheduled medications administered per providers orders (See MAR). 15 minute checks continued per protocol for patient safety.  R: Patient cooperative and receptive to nursing interventions. Pt remains safe.

## 2015-04-05 NOTE — Clinical Social Work Note (Signed)
CSW spoke w Funmi at [facility] re referral of patient - patient can admit to [facility] tomorrow, 7/13.  Must arrive by 10 AM.  Santa GeneraAnne Cunningham, LCSW Clinical Social Worker

## 2015-04-06 NOTE — Progress Notes (Signed)
  [facility] :  Will you be returning to the same living situation after discharge:  No. At discharge, do you have transportation home?: Yes,  via sheriff to [facility] Do you have the ability to pay for your medications: Yes,  will be provided by [facility]  Release of information consent forms completed and in the chart;  Patient's signature needed at discharge.  Patient to Follow up at: Follow-up Information    Follow up with [facility]. Go on 04/06/2015.   Why:  Patient will admit to [facility] today, April 06, 2015   Contact information:   [address],  [place], [place] [address] Phone: [phone] Fax:  [phone]        Safety Planning and Suicide Prevention discussed: No.  Patient declined contact w collaterals, discharging directly to [facility]  Have you used any form of tobacco in the last 30 days? (Cigarettes, Smokeless Tobacco, Cigars, and/or Pipes): Yes  Has patient been referred to the Quitline?: Patient refused referral  Sallee LangeCunningham, Blake Norgaard C 04/06/2015, 1:54 PM

## 2015-04-06 NOTE — Progress Notes (Signed)
This Clinical research associatewriter [facility] and spoke with Officer [place] regarding coordination of am transportation to [facility]. I left message for Sgt. Paschal on his voicemail at the officers recommendation including time papers were served 04/05/2015 @ 1705. Denver FasterVictoria Eman Rynders RN-BC

## 2015-04-06 NOTE — Progress Notes (Signed)
Pt is D/C to treatment facility. Pt denies SI/HI/AV. Pt follow-up and medications were reviewed and pt verbalized understanding. Pt pleasant and cooperative. Pt belongings were returned.

## 2016-01-23 DEATH — deceased
